# Patient Record
Sex: Female | Born: 1980 | Race: Black or African American | Hispanic: No | Marital: Single | Smoking: Never smoker
Health system: Southern US, Community
[De-identification: ages and names within clinical notes are randomized; demographics above are authoritative.]

## PROBLEM LIST (undated history)

## (undated) DIAGNOSIS — Z803 Family history of malignant neoplasm of breast: Secondary | ICD-10-CM

## (undated) DIAGNOSIS — E669 Obesity, unspecified: Secondary | ICD-10-CM

## (undated) HISTORY — DX: Family history of malignant neoplasm of breast: Z80.3

---
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
## 2001-03-04 ENCOUNTER — Encounter (INDEPENDENT_AMBULATORY_CARE_PROVIDER_SITE_OTHER): Payer: Self-pay

## 2001-03-04 ENCOUNTER — Inpatient Hospital Stay (HOSPITAL_COMMUNITY): Admission: AD | Admit: 2001-03-04 | Discharge: 2001-03-06 | Payer: Self-pay | Admitting: Obstetrics and Gynecology

## 2001-03-11 ENCOUNTER — Encounter: Admission: RE | Admit: 2001-03-11 | Discharge: 2001-04-10 | Payer: Self-pay | Admitting: Obstetrics and Gynecology

## 2002-10-20 ENCOUNTER — Emergency Department (HOSPITAL_COMMUNITY): Admission: EM | Admit: 2002-10-20 | Discharge: 2002-10-20 | Payer: Self-pay | Admitting: Emergency Medicine

## 2004-09-23 ENCOUNTER — Emergency Department (HOSPITAL_COMMUNITY): Admission: EM | Admit: 2004-09-23 | Discharge: 2004-09-24 | Payer: Self-pay | Admitting: Emergency Medicine

## 2006-12-04 ENCOUNTER — Emergency Department (HOSPITAL_COMMUNITY): Admission: EM | Admit: 2006-12-04 | Discharge: 2006-12-04 | Payer: Self-pay | Admitting: Emergency Medicine

## 2007-07-21 ENCOUNTER — Emergency Department (HOSPITAL_COMMUNITY): Admission: EM | Admit: 2007-07-21 | Discharge: 2007-07-21 | Payer: Self-pay | Admitting: Emergency Medicine

## 2007-11-01 ENCOUNTER — Emergency Department (HOSPITAL_COMMUNITY): Admission: EM | Admit: 2007-11-01 | Discharge: 2007-11-01 | Payer: Self-pay | Admitting: Emergency Medicine

## 2009-06-23 ENCOUNTER — Emergency Department (HOSPITAL_COMMUNITY): Admission: EM | Admit: 2009-06-23 | Discharge: 2009-06-23 | Payer: Self-pay | Admitting: Emergency Medicine

## 2009-06-28 ENCOUNTER — Emergency Department (HOSPITAL_COMMUNITY): Admission: EM | Admit: 2009-06-28 | Discharge: 2009-06-28 | Payer: Self-pay | Admitting: Family Medicine

## 2010-09-13 ENCOUNTER — Encounter: Admission: RE | Admit: 2010-09-13 | Discharge: 2010-09-13 | Payer: Self-pay | Admitting: Emergency Medicine

## 2011-02-09 LAB — CBC
HCT: 32.5 % — ABNORMAL LOW (ref 36.0–46.0)
Hemoglobin: 10.7 g/dL — ABNORMAL LOW (ref 12.0–15.0)
MCHC: 32.9 g/dL (ref 30.0–36.0)
MCV: 76.2 fL — ABNORMAL LOW (ref 78.0–100.0)
Platelets: 283 10*3/uL (ref 150–400)
RBC: 4.27 MIL/uL (ref 3.87–5.11)
RDW: 18.2 % — ABNORMAL HIGH (ref 11.5–15.5)
WBC: 6.8 10*3/uL (ref 4.0–10.5)

## 2011-02-09 LAB — BASIC METABOLIC PANEL
BUN: 7 mg/dL (ref 6–23)
CO2: 26 mEq/L (ref 19–32)
Calcium: 8.5 mg/dL (ref 8.4–10.5)
Chloride: 105 mEq/L (ref 96–112)
Creatinine, Ser: 0.74 mg/dL (ref 0.4–1.2)
GFR calc Af Amer: >60 mL/min (ref >60)
GFR calc non Af Amer: >60 mL/min (ref >60)
Glucose, Bld: 90 mg/dL (ref 70–99)
Potassium: 3.9 mEq/L (ref 3.5–5.1)
Sodium: 135 mEq/L (ref 135–145)

## 2011-02-09 LAB — URINALYSIS, ROUTINE W REFLEX MICROSCOPIC
Bilirubin Urine: NEGATIVE
Nitrite: NEGATIVE
Protein, ur: NEGATIVE mg/dL
Urobilinogen, UA: 1 mg/dL (ref 0.0–1.0)

## 2011-02-09 LAB — DIFFERENTIAL
Basophils Absolute: 0.1 10*3/uL (ref 0.0–0.1)
Eosinophils Absolute: 0.1 10*3/uL (ref 0.0–0.7)
Lymphocytes Relative: 29 % (ref 12–46)
Lymphs Abs: 2 10*3/uL (ref 0.7–4.0)
Neutro Abs: 4 10*3/uL (ref 1.7–7.7)

## 2011-02-09 LAB — URINE MICROSCOPIC-ADD ON

## 2011-02-09 LAB — POCT PREGNANCY, URINE: Preg Test, Ur: NEGATIVE

## 2011-02-09 LAB — GLUCOSE, CAPILLARY: Glucose-Capillary: 92 mg/dL (ref 70–99)

## 2011-03-22 NOTE — Op Note (Signed)
[facility]  Patient:    MCKENZEY, PARCELL                       MRN: [id number] Proc. Date: 03/04/01 Adm. Date:  [id number] Attending:  Leonard Schwartz                           Operative Report  PREOPERATIVE DIAGNOSES:       1. Term intrauterine pregnancy.                               2. Second stage bradycardia.                               3. Meconium-stained amniotic fluid.  POSTOPERATIVE DIAGNOSES:      1. Term intrauterine pregnancy.                               2. Second stage bradycardia.                               3. Meconium-stained amniotic fluid.                               4. Nuchal cord.  PROCEDURE:                    Vacuum extraction vaginal delivery with                               repair of two first-degree labial lacerations.  OBSTETRICIAN:                 Janine Limbo, M.D.  ANESTHESIA:                   Epidural.  DISPOSITION:                  Ms. Alessandrini is an 30 year old female, gravida 1, para 0, who presents at 39-1/[redacted] weeks gestation in labor. She slowly progressed her cervix to 10 cm. She pushed for an hour and 45 minutes. She began to develop deep decelerations, some of which had a late component. She was noted to have meconium-stained amniotic fluid previously and an amnioinfusion was performed. The patients temperature increased to 100.7. The decision was made to proceed with delivery. We discussed our options which included continued observation, continued pushing, operative vaginal delivery, and cesarean delivery. The risk and benefits of each of those options were reviewed. The patient, the father of the baby, and her friend all elected to proceed with vacuum extraction vaginal delivery. We discussed the specific risk of vacuum extraction vaginal delivery which included caput formation, hematoma formation, the rare risk of intracranial bleeding, and the possibility that the vacuum extraction  would be unsuccessful and we would still need to proceed with a cesarean delivery.  FINDINGS:                     The weight of the infant is currently not known. A female infant was delivered without difficulty. Her name is  Destiny. The Apgars were 3 at one minute and 7 at five minutes. There was no meconium noted beneath the vocal cords. There was a nuchal cord present. The placenta was normal. There were three vessels to the umbilical cord. There was a first-degree laceration at the right labia and a first-degree laceration on the right labia towards the midline. There were no upper vaginal or cervical lacerations present.  DESCRIPTION OF PROCEDURE:     The patient was placed in a lithotomy position. The perineum was prepped with multiple layers of Betadine and then sterilely draped. The bladder was drained of urine. The fetal head was noted to be at +3 station. The cervix was completely dilated and 100% effaced. The infant was in an occiput anterior presentation. The Kiwi vacuum extractor was applied and the patient was allowed to push. One "pop-off" occurred. The patient was able to deliver the fetal head. The mouth and nose were suctioned using the DeLee trap. A nuchal cord was present and it was reduced. The remainder of the infant was delivered. The cord was clamped and cut. The infant was handed to the awaiting pediatric team. They visualized the vocal cords and there was not meconium noted beneath the vocal cords. The Apgars were 3 at one minute and 7 at five minutes. The infants cords were being visualized at the one minute Apgar. The lacerations were noted. The lacerations were repaired using figure-of-eight sutures of 2-0 Vicryl. The upper vagina and cervix were inspected after the placenta was delivered. No lacerations were noted. The patient was returned to the supine position. The infant was allowed to remain in the room with the mother and the father of the baby for  bonding.  ESTIMATED BLOOD LOSS:         400 cc. DD:  03/04/01 TD:  03/05/01 Job: [id number]

## 2011-03-22 NOTE — H&P (Signed)
[facility]  Patient:    Miranda [name], Miranda [name]                       MRN: [id number] Adm. Date:  [id number] Attending:  Leonard Schwartz Dictator:   Wynelle Bourgeois, CNM                         History and Physical  HISTORY OF PRESENT ILLNESS:   Ms. Krupinski is an 30 year old G1, P0 at 39-3/7 weeks who presented earlier this morning with the complaints of regular uterine contractions every two minutes since 4 a.m.  She denied bleeding or leaking and reported positive fetal movement.  Her pregnancy has been followed by Dr. Erie Noe P. Haygood and remarkable for:  #1 - Adolescence, #2 - first trimester bleeding, #3 - unknown LMP, #4 - anembryonic twin gestation on early ultrasound, #5 - group B strep positive.  PRENATAL LABORATORY DATA:     Hemoglobin 11.5, hematocrit 35.4, platelets 242,000.  Blood type O-positive.  Antibody screen negative.  Sickle cell negative.  Rubella not recorded.  HBsAg negative.  HIV nonreactive.  Group B strep positive.  AFP free beta within normal limits.  Glucose challenge within normal limits.  MEDICAL HISTORY:              Remarkable for history of adoption, therefore, patient has a limited knowledge of her family history and her early history.  GENETIC HISTORY:              Patients cousins had twins and the patient also has cousins who are twins.  SOCIAL HISTORY:               Patient is single but involved with Miranda [name], who is involved and supportive.  She denies any alcohol, tobacco or drug use.  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  She is of the [place] faith.  PHYSICAL EXAMINATION:  VITAL SIGNS:                  Stable.  HEENT:                        Within normal limits.  NECK:                         Thyroid normal, not enlarged.  BREASTS:                      Soft, nontender.  No masses.  CARDIOVASCULAR:               Regular rate and rhythm.  RESPIRATORY:                  Clear to auscultation  bilaterally.  ABDOMEN:                      Gravid, vertex to [employer].  Fetal monitor denotes reactive fetal heart rate tracing, with uterine contractions every two to three minutes.  EXTREMITIES:                  Within normal limits.  PELVIC:                       Cervical exam 5 cm, 75% effaced, -2 station per Dr. Marlowe Sax. Stringers exam.  Her membranes were  ruptured artificially by Dr. Stefano Gaul for return of no fluid.  ASSESSMENT:                   1. Intrauterine pregnancy at 39-3/7 weeks.                               2. Active labor.                               3. Group B streptococcus positive.  PLAN:                         1. Admit to birthing suites.                               2. Routine M.D. orders.                               3. Further plan per Dr. Stefano Gaul. DD:  03/04/01 TD:  03/04/01 Job: [id number]

## 2011-03-22 NOTE — Discharge Summary (Signed)
[facility]  Patient:    Miranda [name], Miranda [name]                         MRN: [id number] Adm. Date:  03/04/01 Disc. Date: 03/06/01 Attending:  Janine Limbo, M.D. Dictator:   Nigel Bridgeman, C.N.M.                           Discharge Summary  ADMITTING DIAGNOSES:          1. Term pregnancy.                               2. Positive group Beta streptococcus.  DISCHARGE DIAGNOSES:          1. Term pregnancy.                               2. Second stage bradycardia.                               3. Meconium fluid.                               4. Nuchal cord.  PROCEDURES:                   1. Vacuum-assisted vaginal birth.                               2. Repair of first-degree labial lacerations.  HOSPITAL COURSE:              Miranda [name] is an 30 year old, gravida 1, para 0 at 39-3/7 weeks who was admitted on Mar 04, 2001 in early labor. Her pregnancy had been remarkable for (1) adolescence, (2) first trimester bleeding, (3) questionable last menstrual period, (4) anembryonic twin gestation on early ultrasound, (5) group B strep positive. On admission, her cervix was 5, 75%, vertex, at a -2 station. Group B strep prophylaxis was undertaken in a usual fashion. The patient did have light meconium stained fluid and an amnioinfusion was begun. Throughout her labor she had sporadic late decelerations, but then they generally did improve. Pitocin was utilized intermittently during her labor. She progressed to fully dilated, and pushed for approximately 1 hour and 45 minutes. She began to develop deep decelerations, some of which had a late component. The option of operative vaginal birth was given to the patient and she did consent to that. Dr. Stefano Gaul then performed a vacuum-assisted vaginal birth for a viable female infant by the name of Miranda [name], weight 7 pounds 10 ounces, Apgars were 3 and 7. There was a nuchal cord noted. There was no meconium below the  vocal cords. The infant tolerated the procedure well and was taken to the full-term nursery. Mother was taken to the recovery phase in good condition. Estimated blood loss was 400 cc.  On postoperative day #1, the patient was doing well. Her hemoglobin was 9.3, down from 11.0. Her physical exam was within normal limits. She remained undecided about birth control. By postpartum day #2, the patient was doing well. She was up ad lib. She was deemed to have received the full  benefit of her hospital stay and was discharged home.  DISCHARGE INSTRUCTIONS:       Instructions are per [facility] handout.  DISCHARGE MEDICATIONS:        1. Motrin 600 mg p.o. q.6h. p.r.n. pain.                               2. Prenatal vitamin one p.o. q.d.  DISCHARGE FOLLOWUP:           Followup will occur in six weeks at [facility]. DD:  03/06/01 TD:  03/06/01 Job: [id number]

## 2013-12-14 ENCOUNTER — Emergency Department (HOSPITAL_COMMUNITY)
Admission: EM | Admit: 2013-12-14 | Discharge: 2013-12-14 | Discharge status: Absent without leave | Payer: BC Managed Care – PPO | Source: Home / Self Care

## 2014-08-08 ENCOUNTER — Other Ambulatory Visit: Payer: Self-pay | Admitting: Obstetrics and Gynecology

## 2014-08-08 DIAGNOSIS — N63 Unspecified lump in unspecified breast: Secondary | ICD-10-CM

## 2014-08-12 ENCOUNTER — Ambulatory Visit
Admission: RE | Admit: 2014-08-12 | Discharge: 2014-08-12 | Disposition: A | Discharge status: Home / Self Care | Payer: BC Managed Care – PPO | Source: Ambulatory Visit | Attending: Obstetrics and Gynecology | Admitting: Obstetrics and Gynecology

## 2014-08-12 DIAGNOSIS — N63 Unspecified lump in unspecified breast: Secondary | ICD-10-CM

## 2015-09-18 ENCOUNTER — Encounter: Payer: Self-pay | Admitting: Internal Medicine

## 2015-09-18 ENCOUNTER — Ambulatory Visit (INDEPENDENT_AMBULATORY_CARE_PROVIDER_SITE_OTHER): Payer: BC Managed Care – PPO | Admitting: Internal Medicine

## 2015-09-18 DIAGNOSIS — IMO0001 Reserved for inherently not codable concepts without codable children: Secondary | ICD-10-CM

## 2015-09-18 DIAGNOSIS — Z803 Family history of malignant neoplasm of breast: Secondary | ICD-10-CM | POA: Insufficient documentation

## 2015-09-18 DIAGNOSIS — Z638 Other specified problems related to primary support group: Secondary | ICD-10-CM | POA: Insufficient documentation

## 2015-09-18 DIAGNOSIS — R030 Elevated blood-pressure reading, without diagnosis of hypertension: Secondary | ICD-10-CM | POA: Insufficient documentation

## 2015-09-18 NOTE — Patient Instructions (Signed)
Bariatric Surgery Information Bariatric surgery, also called weight loss surgery, is a procedure that helps you lose weight. You may consider or your health care provider may suggest bariatric surgery if:  You are severely obese and have been unable to lose weight through diet and exercise.  You have health problems related to obesity, such as:  Type 2 diabetes.  Heart disease.  Lung disease. HOW DOES BARIATRIC SURGERY HELP ME LOSE WEIGHT?  Bariatric surgery helps you lose weight by decreasing how much food your body absorbs. This is done by closing off part of your stomach to make it smaller. This restricts the amount of food your stomach can hold. Bariatric surgery can also change your body's regular digestive process, so that food bypasses the parts of your body that absorb calories and nutrients.  If you decide to have bariatric surgery, it is important to continue to eat a healthy diet and exercise regularly after the surgery.  WHAT ARE THE DIFFERENT KINDS OF BARIATRIC SURGERY?  There are two kinds of bariatric surgeries:  Restrictive surgeries make your stomach smaller. They do not change your digestive process. The smaller the size of your new stomach, the less food you can eat. There are different types of restrictive surgeries.  Malabsorptive surgeries both make your stomach smaller and alter your digestive process so that your body processes less calories and nutrients. These are the most common kind of bariatric surgery. There are different types of malabsorptive surgeries. WHAT ARE THE DIFFERENT TYPES OF RESTRICTIVE SURGERY? Adjustable Gastric Banding In this procedure, an inflatable band is placed around your stomach near the upper end. This makes the passageway for food into the rest of your stomach much smaller. The band can be adjusted, making it tighter or looser, by filling it with salt solution. Your Lippmann can adjust the band based on how are you feeling and how much  weight you are losing. The band can be removed in the future.  Vertical Banded Gastroplasty In this procedure, staples are used to separate your stomach into two parts, a small upper pouch and a bigger lower pouch. This decreases how much food you can eat. Sleeve Gastrectomy In this procedure, your stomach is made smaller. This is done by surgically removing a large part of your stomach. When your stomach is smaller, you feel full more quickly and reduce how much you eat. WHAT ARE THE DIFFERENT TYPES OF MALABSORPTIVE SURGERY? Roux-en-Y Gastric Bypass (RGB) This is the most common weight loss surgery. In this procedure, a small stomach pouch is created in the upper part of your stomach. Next, this small stomach pouch is attached directly to the middle part of your small intestine. The farther down your small intestine the new connection is made, the fewer calories and nutrients you will absorb.  Biliopancreatic Diversion with Duodenal Switch (BPD/DS)  This is a multi-step procedure. In this procedure, a large part of your stomach is removed, making your stomach smaller. Next, this smaller stomach is attached to the lower part of your small intestine. Like the RGB surgery, you absorb fewer calories and nutrients the farther down your small intestine the attachment is made.   WHAT ARE THE RISKS OF BARIATRIC SURGERY? As with any surgical procedure, each type of bariatric surgery has its own risks. These risks also depend on your age, your overall health, and any other medical conditions you may have. When deciding on bariatric surgery, it is very important to:   Talk to your health care provider   and choose the surgery that is best for you.  Ask your health care provider about specific risks for the surgery you choose. FOR MORE INFORMATION  American Society for Metabolic & Bariatric Surgery: www.asmbs.org  Weight-control Information Network (WIN): win.niddk.nih.gov   This information is not  intended to replace advice given to you by your health care provider. Make sure you discuss any questions you have with your health care provider.   Document Released: 10/21/2005 Document Revised: 07/12/2015 Document Reviewed: 04/21/2013 Elsevier Interactive Patient Education 2016 Elsevier Inc.  

## 2015-09-18 NOTE — Progress Notes (Signed)
Pre visit review using our clinic review tool, if applicable. No additional management support is needed unless otherwise documented below in the visit note. 

## 2015-09-18 NOTE — Assessment & Plan Note (Signed)
Discussed diet and exercise Discussed medications like Phentermine She is interested in starting weight watchers and will look more into that

## 2015-09-18 NOTE — Assessment & Plan Note (Signed)
She is not interested in any medication at this time She is interested in trying counseling- referral placed to psychology

## 2015-09-18 NOTE — Assessment & Plan Note (Signed)
Discussed how weight/stress can contribute to her problem Will continue to monitor

## 2015-09-18 NOTE — Assessment & Plan Note (Signed)
She is past due for follow up Will have her call and make an appt

## 2015-09-18 NOTE — Progress Notes (Signed)
HPI  Pt presents to the clinic today to establish care. She has not had a PCP in many years. She does get care from [facility].   Flu: never Tetanus: > 10 years ago Pap Smear: 2015- normal Mammogram: [facility], she goes every 6 months, they are following an abnormal mass, mother died at age 33 of breast cancer Dentist: biannually  Her blood pressure is elevated today at 138/96. She has no history of HTN. She denies chest pain, chest tightness or shortness of breath.  She does reports stress at home dealing with her daughter. She feels like she can never get a break and never has time to herself. Sometimes she gets so stressed out that she starts having chest tightness and shortness of breath. She has no history of anxiety, depression or SI/HI.  She also is not sure what to do about her weight. She feels like she has gained 20 lbs in the last 3 months. She does admit to eating out a lot and not exercising she should. She often skips breakfast or drinks a smoothie. For lunch she will have chicken and rice. For dinner, she will eat meat and veggies. She tries to avoid carbs.   No past medical history on file.  No current outpatient prescriptions on file.   No current facility-administered medications for this visit.    No Known Allergies  Family History  Problem Relation Age of Onset  . Breast cancer Mother 90    Social History   Social History  . Marital Status: Single    Spouse Name: N/A  . Number of Children: N/A  . Years of Education: N/A   Occupational History  . Not on file.   Social History Main Topics  . Smoking status: Never Smoker   . Smokeless tobacco: Never Used  . Alcohol Use: No  . Drug Use: No  . Sexual Activity: Not on file   Other Topics Concern  . Not on file   Social History Narrative  . No narrative on file    ROS:  Constitutional: Pt reports weight gain. Denies fever, malaise, fatigue, headache.  HEENT: Denies eye  pain, eye redness, ear pain, ringing in the ears, wax buildup, runny nose, nasal congestion, bloody nose, or sore throat. Respiratory: Denies difficulty breathing, shortness of breath, cough or sputum production.   Cardiovascular: Pt reports chest tightness. chest pain, palpitations or swelling in the hands or feet.  Gastrointestinal: Denies abdominal pain, bloating, constipation, diarrhea or blood in the stool.  GU: Denies frequency, urgency, pain with urination, blood in urine, odor or discharge. Musculoskeletal: Denies decrease in range of motion, difficulty with gait, muscle pain or joint pain and swelling.  Skin: Denies redness, rashes, lesions or ulcercations.  Neurological: Denies dizziness, difficulty with memory, difficulty with speech or problems with balance and coordination.  Psych: Pt reports stress. Denies anxiety, SI/HI.  No other specific complaints in a complete review of systems (except as listed in HPI above).  PE:  BP 138/96 mmHg  Pulse 96  Temp(Src) 98.2 F (36.8 C) (Oral)  Ht '5\' 1"'  (1.549 m)  Wt 275 lb 8 oz (124.966 kg)  BMI 52.08 kg/m2  SpO2 98%  LMP 09/03/2015 Wt Readings from Last 3 Encounters:  09/18/15 275 lb 8 oz (124.966 kg)    General: Appears her stated age, obese in NAD.  Skin: Warm, dry and intact. Cardiovascular: Normal rate and rhythm. S1,S2 noted.  No murmur, rubs or gallops  noted.  Pulmonary/Chest: Normal effort and positive vesicular breath sounds. No respiratory distress. No wheezes, rales or ronchi noted.  Neurological: Alert and oriented.  Psychiatric: Mood and affect normal. Behavior is normal. Judgment and thought content normal.  :  BMET    Component Value Date/Time   NA 135 06/23/2009 1950   K 3.9 06/23/2009 1950   CL 105 06/23/2009 1950   CO2 26 06/23/2009 1950   GLUCOSE 90 06/23/2009 1950   BUN 7 06/23/2009 1950   CREATININE 0.74 06/23/2009 1950   CALCIUM 8.5 06/23/2009 1950   GFRNONAA >60 06/23/2009 1950   GFRAA   06/23/2009 1950    >60        The eGFR has been calculated using the MDRD equation. This calculation has not been validated in all clinical situations. eGFR's persistently <60 mL/min signify possible Chronic Kidney Disease.    Lipid Panel  No results found for: CHOL, TRIG, HDL, CHOLHDL, VLDL, LDLCALC  CBC    Component Value Date/Time   WBC 6.8 06/23/2009 1950   RBC 4.27 06/23/2009 1950   HGB 10.7* 06/23/2009 1950   HCT 32.5* 06/23/2009 1950   PLT 283 06/23/2009 1950   MCV 76.2* 06/23/2009 1950   MCHC 32.9 06/23/2009 1950   RDW 18.2* 06/23/2009 1950   LYMPHSABS 2.0 06/23/2009 1950   MONOABS 0.6 06/23/2009 1950   EOSABS 0.1 06/23/2009 1950   BASOSABS 0.1 06/23/2009 1950    Hgb A1C No results found for: HGBA1C   Assessment and Plan:

## 2015-10-26 ENCOUNTER — Ambulatory Visit: Payer: BC Managed Care – PPO | Admitting: Psychology

## 2015-11-08 ENCOUNTER — Telehealth: Payer: Self-pay | Admitting: Internal Medicine

## 2015-11-08 ENCOUNTER — Emergency Department (HOSPITAL_COMMUNITY)
Admission: EM | Admit: 2015-11-08 | Discharge: 2015-11-08 | Disposition: A | Discharge status: Home / Self Care | Payer: BC Managed Care – PPO | Attending: Emergency Medicine | Admitting: Emergency Medicine

## 2015-11-08 ENCOUNTER — Emergency Department (HOSPITAL_COMMUNITY): Payer: BC Managed Care – PPO

## 2015-11-08 ENCOUNTER — Encounter (HOSPITAL_COMMUNITY): Payer: Self-pay | Admitting: *Deleted

## 2015-11-08 DIAGNOSIS — R0781 Pleurodynia: Secondary | ICD-10-CM | POA: Diagnosis not present

## 2015-11-08 DIAGNOSIS — M7989 Other specified soft tissue disorders: Secondary | ICD-10-CM | POA: Insufficient documentation

## 2015-11-08 DIAGNOSIS — R079 Chest pain, unspecified: Secondary | ICD-10-CM | POA: Diagnosis present

## 2015-11-08 DIAGNOSIS — R091 Pleurisy: Secondary | ICD-10-CM | POA: Diagnosis not present

## 2015-11-08 DIAGNOSIS — E669 Obesity, unspecified: Secondary | ICD-10-CM | POA: Insufficient documentation

## 2015-11-08 DIAGNOSIS — D649 Anemia, unspecified: Secondary | ICD-10-CM | POA: Insufficient documentation

## 2015-11-08 HISTORY — DX: Obesity, unspecified: E66.9

## 2015-11-08 LAB — BASIC METABOLIC PANEL
Anion gap: 7 (ref 5–15)
BUN: 9 mg/dL (ref 6–20)
CALCIUM: 8.8 mg/dL — AB (ref 8.9–10.3)
CO2: 23 mmol/L (ref 22–32)
CREATININE: 0.53 mg/dL (ref 0.44–1.00)
Chloride: 109 mmol/L (ref 101–111)
GFR calc non Af Amer: >60 mL/min (ref >60)
GLUCOSE: 143 mg/dL — AB (ref 65–99)
Potassium: 3.4 mmol/L — ABNORMAL LOW (ref 3.5–5.1)
Sodium: 139 mmol/L (ref 135–145)

## 2015-11-08 LAB — CBC
HCT: 29.8 % — ABNORMAL LOW (ref 36.0–46.0)
Hemoglobin: 8.9 g/dL — ABNORMAL LOW (ref 12.0–15.0)
MCH: 20.5 pg — AB (ref 26.0–34.0)
MCHC: 29.9 g/dL — AB (ref 30.0–36.0)
MCV: 68.5 fL — ABNORMAL LOW (ref 78.0–100.0)
PLATELETS: 330 10*3/uL (ref 150–400)
RBC: 4.35 MIL/uL (ref 3.87–5.11)
RDW: 19 % — ABNORMAL HIGH (ref 11.5–15.5)
WBC: 6.4 10*3/uL (ref 4.0–10.5)

## 2015-11-08 LAB — I-STAT TROPONIN, ED: TROPONIN I, POC: 0 ng/mL (ref 0.00–0.08)

## 2015-11-08 LAB — D-DIMER, QUANTITATIVE: D-Dimer, Quant: 0.43 ug/mL-FEU (ref 0.00–0.50)

## 2015-11-08 MED ORDER — AZITHROMYCIN 250 MG PO TABS: 250.0000 mg | ORAL_TABLET | Freq: Every day | ORAL | Status: DC

## 2015-11-08 MED ORDER — NAPROXEN 500 MG PO TABS: 500.0000 mg | ORAL_TABLET | Freq: Two times a day (BID) | ORAL | Status: DC

## 2015-11-08 MED ORDER — IBUPROFEN 400 MG PO TABS
600.0000 mg | ORAL_TABLET | Freq: Once | ORAL | Status: AC
  Administered 2015-11-08: 600 mg via ORAL
  Filled 2015-11-08: qty 1

## 2015-11-08 NOTE — Telephone Encounter (Signed)
Per chart review pt is at [facility]. 

## 2015-11-08 NOTE — ED Notes (Signed)
Pt reports chest pains that occur when breathing. Also having back and shoulder pain. ekg done at triage, no acute distress noted.

## 2015-11-08 NOTE — ED Provider Notes (Signed)
CSN: [id number]     Arrival date & time 11/08/15  1437 History   First MD Initiated Contact with Patient 11/08/15 2111     Chief Complaint  Patient presents with  . Chest Pain     (Consider location/radiation/quality/duration/timing/severity/associated sxs/prior Treatment) HPI Miranda [name] is a 35 y.o. female with history of obesity, presents to emergency department complaining of right-sided chest pain. Patient states pain is in the right lower chest radiating into the back. Pain is worsened with deep breathing and coughing. Patient sometimes feels pain with movement. Denies any recent illnesses or flulike symptoms. Denies cough. Denies any recent travel or surgeries. She states she has some ankle swelling in both legs that has been there for some time. She denies any shortness of breath. She denies any history of blood clots. She is not a smoker. She denies any history of similar pain in the past. No nausea or vomiting. No abdominal pain. No fever or chills. No hemoptysis. She has not tried taking any medications for this pain. 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  Past Medical History  Diagnosis Date  . Obesity    History reviewed. No pertinent past surgical history. Family History  Problem Relation Age of Onset  . Adopted: Yes  . Breast cancer Mother 535   Social History  Substance Use Topics  . Smoking status: Never Smoker   . Smokeless tobacco: Never Used  . Alcohol Use: No   OB History    No data available     Review of Systems  Constitutional: Negative for fever and chills.  Respiratory: Positive for chest tightness. Negative for cough, shortness of breath and wheezing.   Cardiovascular: Positive for chest pain and leg swelling. Negative for palpitations.  Gastrointestinal: Negative for nausea, vomiting, abdominal pain and diarrhea.  Musculoskeletal: Negative for myalgias, arthralgias,  neck pain and neck stiffness.  Skin: Negative for rash.  Neurological: Negative for dizziness, weakness and headaches.  All other systems reviewed and are negative.     Allergies  Review of patient's allergies indicates no known allergies.  Home Medications   Prior to Admission medications   Not on File   BP 132/79 mmHg  Pulse 87  Temp(Src) 99.5 F (37.5 C) (Oral)  Resp 18  Ht 5' (1.524 m)  Wt 122.471 kg  BMI 52.73 kg/m2  SpO2 99%  LMP 10/05/2015 Physical Exam  Constitutional: She appears well-developed and well-nourished. No distress.  HENT:  Head: Normocephalic.  Eyes: Conjunctivae are normal.  Neck: Neck supple.  Cardiovascular: Normal rate, regular rhythm and normal heart sounds.   Pulmonary/Chest: Effort normal and breath sounds normal. No respiratory distress. She has no wheezes. She has no rales. She exhibits no tenderness.  Abdominal: Soft. There is no tenderness. There is no rebound.  Musculoskeletal: She exhibits no edema.  Neurological: She is alert.  Skin: Skin is warm and dry.  Psychiatric: She has a normal mood and affect. Her behavior is normal.  Nursing note and vitals reviewed.   ED Course  Procedures (including critical care time) Labs Review Labs Reviewed  BASIC METABOLIC PANEL - Abnormal; Notable for the following:    Potassium 3.4 (*)    Glucose, Bld 143 (*)    Calcium 8.8 (*)    All other components within normal limits  CBC - Abnormal; Notable for the following:    Hemoglobin 8.9 (*)  HCT 29.8 (*)    MCV 68.5 (*)    MCH 20.5 (*)    MCHC 29.9 (*)    RDW 19.0 (*)    All other components within normal limits  D-DIMER, QUANTITATIVE (NOT AT [facility])  Rosezena Sensor, ED    Imaging Review Dg Chest 2 View  11/08/2015  CLINICAL DATA:  Chest pain, right shoulder pain, and back pain for 1 week. EXAM: CHEST  2 VIEW COMPARISON:  07/21/2007 FINDINGS: The cardiac silhouette is mildly enlarged, stable to minimally more prominent than on the  prior study. The patient has taken a slightly shallower inspiration than on the prior study with mild bronchovascular crowding. Slightly increased density is present in the right infrahilar region, without corresponding abnormality identified on the lateral radiograph. No edema, pleural effusion, or pneumothorax is identified. No acute osseous abnormality is identified. IMPRESSION: Mild cardiomegaly. Shallower inspiration with minimal right basilar opacity favored to reflect atelectasis although developing infiltrate is not completely excluded. Electronically Signed   By: Sebastian Ache M.D.   On: 11/08/2015 15:30   I have personally reviewed and evaluated these images and lab results as part of my medical decision-making.   EKG Interpretation   Date/Time:  Wednesday November 08 2015 14:41:59 EST Ventricular Rate:  86 PR Interval:  176 QRS Duration: 98 QT Interval:  388 QTC Calculation: 464 R Axis:   113 Text Interpretation:  Normal sinus rhythm Incomplete right bundle branch  block Possible Right ventricular hypertrophy Abnormal ECG No significant  change since last tracing Confirmed by Bebe Shaggy  MD, Dorinda Hill ([id number]) on  11/08/2015 9:16:07 PM      MDM   Final diagnoses:  Right-sided chest pain  Pleurisy  Anemia, unspecified anemia type    patient in emergency department with complaint of right-sided pleuritic chest pain. She denies any associated symptoms. Specifically no risk factors for PE, no cough, no fever, no flulike symptoms. Her pain is worse with deep breathing and movement. Patient's vital signs are normal. She is not hypoxic or tachycardic. She is low risk for PE,  D-dimer was obtained and negative.  Her lab work was obtained at triage and showed low hemoglobin , with microcytic anemia. Most likely from her heavy vaginal bleeding. She states she  Was told similar before approximately  10 years ago and had to be on iron. Instructed her to start taking her supplements and follow-up  with her doctor. Patient's glucose also elevated 143, advised to watch her diet and follow-up with her doctor for prediabetes testing. Her chest x-ray shows  Atelectasis versus developing infiltrate in the right lung, which corresponds to where patient's pain is.   Will start on Z-Pak to cover for possible early pneumonia, however most likely pleurisy versus atelectasis. Will start on NSAIDs. Instructed her to follow with her doctor in 3-5 days if pain is not improving for recheck. Pt voiced understanding and had no further questions.   Filed Vitals:   11/08/15 1448  BP: 132/79  Pulse: 87  Temp: 99.5 F (37.5 C)  TempSrc: Oral  Resp: 18  Height: 5' (1.524 m)  Weight: 122.471 kg  SpO2: 99%      Jaynie Crumble, PA-C 11/08/15 2234  Zadie Rhine, MD 11/09/15 [phone]

## 2015-11-08 NOTE — Discharge Instructions (Signed)
Naproxen for pain and inflammation as prescribed. Take Z-Pak for possible early pneumonia that was seen on x-ray. Your lab work shows anemia, please start taking iron supplements and follow-up with your doctor for recheck. Yuor lab work also shows slightly elevated blood sugar, watch your diet closely, exercise, follow with you doctor for recheck. If not improving return or be rechecked in 3-5 days.   Pleurisy Pleurisy is an inflammation and swelling of the lining of the lungs (pleura). Because of this inflammation, it hurts to breathe. It can be aggravated by coughing, laughing, or deep breathing. Pleurisy is often caused by an underlying infection or disease.  HOME CARE INSTRUCTIONS  Monitor your pleurisy for any changes. The following actions may help to alleviate any discomfort you are experiencing:  Medicine may help with pain. Only take over-the-counter or prescription medicines for pain, discomfort, or fever as directed by your health care provider.  Only take antibiotic medicine as directed. Make sure to finish it even if you start to feel better. SEEK MEDICAL CARE IF:   Your pain is not controlled with medicine or is increasing.  You have an increase in pus-like (purulent) secretions brought up with coughing. SEEK IMMEDIATE MEDICAL CARE IF:   You have blue or dark lips, fingernails, or toenails.  You are coughing up blood.  You have increased difficulty breathing.  You have continuing pain unrelieved by medicine or pain lasting more than 1 week.  You have pain that radiates into your neck, arms, or jaw.  You develop increased shortness of breath or wheezing.  You develop a fever, rash, vomiting, fainting, or other serious symptoms. MAKE SURE YOU:  Understand these instructions.   Will watch your condition.   Will get help right away if you are not doing well or get worse.    This information is not intended to replace advice given to you by your health care  provider. Make sure you discuss any questions you have with your health care provider.   Document Released: 10/21/2005 Document Revised: 06/23/2013 Document Reviewed: 04/04/2013 Elsevier Interactive Patient Education [employer]2016 [employer] Inc.

## 2015-11-08 NOTE — Telephone Encounter (Signed)
[facility] [place] [facility] - Client TELEPHONE ADVICE RECORD [facility] Patient Name: Miranda [name] DOB: 03/08/1981 Initial Comment Caller states she has chest pains. Nurse Assessment Nurse: Roderic OvensNorth, RN, Amy Date/Time Lamount Cohen(Eastern Time): 11/08/2015 1:29:52 PM Confirm and document reason for call. If symptomatic, describe symptoms. ---CALLER STATES THAT WHEN SHE IS TAKING A DEEP BREATH SHE IS HAVING CHEST PAIN. SHE STATES THIS HAS BEEN GOING ON FOR A BOUT A WEEK. SHE STATES IT HAS WORSENING SYMPTOMS. SHE THOUGHT IS WAS A WEEK NOW. WHEN SHE PRESSES ON THE CHEST IT DOES NOT HURT. WHEN SHE INHALES IS WHEN THE PAIN IS THERE. PRESSURE WHEN SHE INHALES AND A SHARP PAIN WHEN SHE BREATHES OUT. SHE STATES IT HAS DONE THIS BEFORE AND SHE HAS GONE INTO URGENT CARE FOR IT. Has the patient traveled out of the country within the last 30 days? ---Not Applicable Does the patient have any new or worsening symptoms? ---Yes Will a triage be completed? ---Yes Related visit to physician within the last 2 weeks? ---No Does the PT have any chronic conditions? (i.e. diabetes, asthma, etc.) ---No Did the patient indicate they were pregnant? ---No Is this a behavioral health or substance abuse call? ---No Guidelines Guideline Title Affirmed Question Affirmed Notes Chest Pain Pain also present in shoulder(s) or arm(s) or jaw (Exception: pain is clearly made worse by movement) Final Disposition User Go to ED Now Roderic OvensNorth, RN, Amy [facility] Disagree/Comply: Comply

## 2015-12-25 ENCOUNTER — Ambulatory Visit (INDEPENDENT_AMBULATORY_CARE_PROVIDER_SITE_OTHER)
Admission: RE | Admit: 2015-12-25 | Discharge: 2015-12-25 | Disposition: A | Discharge status: Home / Self Care | Payer: BC Managed Care – PPO | Source: Ambulatory Visit | Attending: Internal Medicine | Admitting: Internal Medicine

## 2015-12-25 ENCOUNTER — Encounter: Payer: Self-pay | Admitting: Internal Medicine

## 2015-12-25 ENCOUNTER — Ambulatory Visit (INDEPENDENT_AMBULATORY_CARE_PROVIDER_SITE_OTHER): Payer: BC Managed Care – PPO | Admitting: Internal Medicine

## 2015-12-25 VITALS — BP 138/94 | HR 79 | Temp 98.2°F | Wt 275.0 lb

## 2015-12-25 DIAGNOSIS — R2232 Localized swelling, mass and lump, left upper limb: Secondary | ICD-10-CM | POA: Diagnosis not present

## 2015-12-25 DIAGNOSIS — J189 Pneumonia, unspecified organism: Secondary | ICD-10-CM

## 2015-12-25 DIAGNOSIS — J181 Lobar pneumonia, unspecified organism: Principal | ICD-10-CM

## 2015-12-25 NOTE — Progress Notes (Signed)
Subjective:    Patient ID: Miranda [name], female    DOB: October 14, 1981, 35 y.o.   MRN: [id number]  HPI  Pt presents to the clinic today for ER follow up from 11/08/15. She went to the ER c/o right sided chest wall pain. ECG showed incomplete right bundle branch block. She did have a slight anemia on her labs, 8.9/29.8. Her D dimer was normal. Her chest xray showed possible RLL infiltrate. She was treated for a possible pneumonia with Azithromax. She reports she is feeling much better. She occasionally has left upper chest pain. She usually notices this first thing in the morning after waking up. She describes the pain as tightness. She denies shortness of breath. She feels like it is because she is sleeping on her left side. She denies reflux or heartburn. She has not taken anything OTC for her left side chest pain.  She also has a lump on her left shoulder she would like to get checked out. She noticed this a few months ago. The lump is not tender. She has noticed that it has gotten bigger is size. It has not drained. She did try to pop it with a pin, but nothing drained from it. She has never had a lump like this before.  Review of Systems  Past Medical History  Diagnosis Date  . Obesity     No current outpatient prescriptions on file.   No current facility-administered medications for this visit.    No Known Allergies  Family History  Problem Relation Age of Onset  . Adopted: Yes  . Breast cancer Mother 20    Social History   Social History  . Marital Status: Single    Spouse Name: N/A  . Number of Children: N/A  . Years of Education: N/A   Occupational History  . Not on file.   Social History Main Topics  . Smoking status: Never Smoker   . Smokeless tobacco: Never Used  . Alcohol Use: No  . Drug Use: No  . Sexual Activity: No   Other Topics Concern  . Not on file   Social History Narrative     Constitutional: Denies fever, malaise, fatigue, headache or abrupt  weight changes.  HEENT: Denies eye pain, eye redness, ear pain, ringing in the ears, wax buildup, runny nose, nasal congestion, bloody nose, or sore throat. Respiratory: Denies difficulty breathing, shortness of breath, cough or sputum production.   Cardiovascular: Pt reports left chest tightness. Denies chest pain, chest tightness, palpitations or swelling in the hands or feet.   Skin: Pt reports lump on her left shoulder. Denies redness, rashes, or ulcercations.  Neurological: Denies dizziness, difficulty with memory, difficulty with speech or problems with balance and coordination.  No other specific complaints in a complete review of systems (except as listed in HPI above).     Objective:   Physical Exam  BP 138/94 mmHg  Pulse 79  Temp(Src) 98.2 F (36.8 C) (Oral)  Wt 275 lb (124.739 kg)  SpO2 98%  LMP 12/01/2015 Wt Readings from Last 3 Encounters:  12/25/15 275 lb (124.739 kg)  11/08/15 270 lb (122.471 kg)  09/18/15 275 lb 8 oz (124.966 kg)    General: Appears her  stated age, obese in NAD. Skin: Warm, dry and intact. Pea sized hyperpigmented nodule noted on left shoulder. Cardiovascular: Normal rate and rhythm. S1,S2 noted.  No murmur, rubs or gallops noted. Pulmonary/Chest: Normal effort and positive vesicular breath sounds. No respiratory distress. No wheezes, rales  or ronchi noted.  Neurological: Alert and oriented.Marland Kitchen  Psychiatric: Mood and affect normal. Behavior is normal. Judgment and thought content normal.     BMET    Component Value Date/Time   NA 139 11/08/2015 1502   K 3.4* 11/08/2015 1502   CL 109 11/08/2015 1502   CO2 23 11/08/2015 1502   GLUCOSE 143* 11/08/2015 1502   BUN 9 11/08/2015 1502   CREATININE 0.53 11/08/2015 1502   CALCIUM 8.8* 11/08/2015 1502   GFRNONAA >60 11/08/2015 1502   GFRAA >60 11/08/2015 1502    Lipid Panel  No results found for: CHOL, TRIG, HDL, CHOLHDL, VLDL, LDLCALC  CBC    Component Value Date/Time   WBC 6.4 11/08/2015  1502   RBC 4.35 11/08/2015 1502   HGB 8.9* 11/08/2015 1502   HCT 29.8* 11/08/2015 1502   PLT 330 11/08/2015 1502   MCV 68.5* 11/08/2015 1502   MCH 20.5* 11/08/2015 1502   MCHC 29.9* 11/08/2015 1502   RDW 19.0* 11/08/2015 1502   LYMPHSABS 2.0 06/23/2009 1950   MONOABS 0.6 06/23/2009 1950   EOSABS 0.1 06/23/2009 1950   BASOSABS 0.1 06/23/2009 1950    Hgb A1C No results found for: HGBA1C       Assessment & Plan:   Hospital follow up for RLL pneumonia:  Hospital notes, labs and imaging reviewed Follow up chest xray today to check for resolution of PNA  Lump, 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

## 2015-12-25 NOTE — Progress Notes (Signed)
Pre visit review using our clinic review tool, if applicable. No additional management support is needed unless otherwise documented below in the visit note. 

## 2015-12-25 NOTE — Patient Instructions (Signed)

## 2015-12-26 ENCOUNTER — Ambulatory Visit: Payer: BC Managed Care – PPO | Admitting: Internal Medicine

## 2016-01-11 ENCOUNTER — Other Ambulatory Visit: Payer: Self-pay | Admitting: Internal Medicine

## 2016-01-11 DIAGNOSIS — Z0000 Encounter for general adult medical examination without abnormal findings: Secondary | ICD-10-CM

## 2016-01-16 ENCOUNTER — Other Ambulatory Visit: Payer: BC Managed Care – PPO

## 2016-01-23 ENCOUNTER — Ambulatory Visit (INDEPENDENT_AMBULATORY_CARE_PROVIDER_SITE_OTHER): Payer: BC Managed Care – PPO | Admitting: Internal Medicine

## 2016-01-23 ENCOUNTER — Encounter: Payer: Self-pay | Admitting: Internal Medicine

## 2016-01-23 VITALS — BP 138/90 | HR 92 | Temp 98.3°F | Ht 60.0 in | Wt 279.0 lb

## 2016-01-23 DIAGNOSIS — Z0001 Encounter for general adult medical examination with abnormal findings: Secondary | ICD-10-CM | POA: Diagnosis not present

## 2016-01-23 DIAGNOSIS — L989 Disorder of the skin and subcutaneous tissue, unspecified: Secondary | ICD-10-CM

## 2016-01-23 DIAGNOSIS — Z0000 Encounter for general adult medical examination without abnormal findings: Secondary | ICD-10-CM

## 2016-01-23 LAB — HEMOGLOBIN A1C: Hgb A1c MFr Bld: 6.6 % — ABNORMAL HIGH (ref 4.6–6.5)

## 2016-01-23 LAB — LIPID PANEL
CHOLESTEROL: 158 mg/dL (ref 0–200)
HDL: 54.4 mg/dL (ref >39.00)
LDL CALC: 94 mg/dL (ref 0–99)
NonHDL: 103.71
TRIGLYCERIDES: 50 mg/dL (ref 0.0–149.0)
Total CHOL/HDL Ratio: 3
VLDL: 10 mg/dL (ref 0.0–40.0)

## 2016-01-23 LAB — COMPREHENSIVE METABOLIC PANEL
ALT: 16 U/L (ref 0–35)
AST: 19 U/L (ref 0–37)
Albumin: 4 g/dL (ref 3.5–5.2)
Alkaline Phosphatase: 77 U/L (ref 39–117)
BILIRUBIN TOTAL: 0.4 mg/dL (ref 0.2–1.2)
BUN: 9 mg/dL (ref 6–23)
CALCIUM: 9 mg/dL (ref 8.4–10.5)
CHLORIDE: 106 meq/L (ref 96–112)
CO2: 26 meq/L (ref 19–32)
Creatinine, Ser: 0.65 mg/dL (ref 0.40–1.20)
GFR: 134.35 mL/min (ref >60.00)
Glucose, Bld: 96 mg/dL (ref 70–99)
Potassium: 3.7 mEq/L (ref 3.5–5.1)
Sodium: 138 mEq/L (ref 135–145)
Total Protein: 7.2 g/dL (ref 6.0–8.3)

## 2016-01-23 LAB — CBC
HEMATOCRIT: 29.4 % — AB (ref 36.0–46.0)
HEMOGLOBIN: 9.1 g/dL — AB (ref 12.0–15.0)
MCHC: 30.8 g/dL (ref 30.0–36.0)
PLATELETS: 446 10*3/uL — AB (ref 150.0–400.0)
RBC: 4.38 Mil/uL (ref 3.87–5.11)
RDW: 20.8 % — ABNORMAL HIGH (ref 11.5–15.5)
WBC: 7.3 10*3/uL (ref 4.0–10.5)

## 2016-01-23 LAB — TSH: TSH: 2.43 u[IU]/mL (ref 0.35–4.50)

## 2016-01-23 NOTE — Patient Instructions (Signed)
Health Maintenance, Female Adopting a healthy lifestyle and getting preventive care can go a long way to promote health and wellness. Talk with your health care provider about what schedule of regular examinations is right for you. This is a good chance for you to check in with your provider about disease prevention and staying healthy. In between checkups, there are plenty of things you can do on your own. Experts have done a lot of research about which lifestyle changes and preventive measures are most likely to keep you healthy. Ask your health care provider for more information. WEIGHT AND DIET  Eat a healthy diet  Be sure to include plenty of vegetables, fruits, low-fat dairy products, and lean protein.  Do not eat a lot of foods high in solid fats, added sugars, or salt.  Get regular exercise. This is one of the most important things you can do for your health.  Most adults should exercise for at least 150 minutes each week. The exercise should increase your heart rate and make you sweat (moderate-intensity exercise).  Most adults should also do strengthening exercises at least twice a week. This is in addition to the moderate-intensity exercise.  Maintain a healthy weight  Body mass index (BMI) is a measurement that can be used to identify possible weight problems. It estimates body fat based on height and weight. Your health care provider can help determine your BMI and help you achieve or maintain a healthy weight.  For females 31 years of age and older:   A BMI below 18.5 is considered underweight.  A BMI of 18.5 to 24.9 is normal.  A BMI of 25 to 29.9 is considered overweight.  A BMI of 30 and above is considered obese.  Watch levels of cholesterol and blood lipids  You should start having your blood tested for lipids and cholesterol at 34 years of age, then have this test every 5 years.  You may need to have your cholesterol levels checked more often if:  Your lipid  or cholesterol levels are high.  You are older than 34 years of age.  You are at high risk for heart disease.  CANCER SCREENING   Lung Cancer  Lung cancer screening is recommended for adults 44-39 years old who are at high risk for lung cancer because of a history of smoking.  A yearly low-dose CT scan of the lungs is recommended for people who:  Currently smoke.  Have quit within the past 15 years.  Have at least a 30-pack-year history of smoking. A pack year is smoking an average of one pack of cigarettes a day for 1 year.  Yearly screening should continue until it has been 15 years since you quit.  Yearly screening should stop if you develop a health problem that would prevent you from having lung cancer treatment.  Breast Cancer  Practice breast self-awareness. This means understanding how your breasts normally appear and feel.  It also means doing regular breast self-exams. Let your health care provider know about any changes, no matter how small.  If you are in your 20s or 30s, you should have a clinical breast exam (CBE) by a health care provider every 1-3 years as part of a regular health exam.  If you are 75 or older, have a CBE every year. Also consider having a breast X-ray (mammogram) every year.  If you have a family history of breast cancer, talk to your health care provider about genetic screening.  If you  are at high risk for breast cancer, talk to your health care provider about having an MRI and a mammogram every year.  Breast cancer gene (BRCA) assessment is recommended for women who have family members with BRCA-related cancers. BRCA-related cancers include:  Breast.  Ovarian.  Tubal.  Peritoneal cancers.  Results of the assessment will determine the need for genetic counseling and BRCA1 and BRCA2 testing. Cervical Cancer Your health care provider may recommend that you be screened regularly for cancer of the pelvic organs (ovaries, uterus, and  vagina). This screening involves a pelvic examination, including checking for microscopic changes to the surface of your cervix (Pap test). You may be encouraged to have this screening done every 3 years, beginning at age 25.  For women ages 74-65, health care providers may recommend pelvic exams and Pap testing every 3 years, or they may recommend the Pap and pelvic exam, combined with testing for human papilloma virus (HPV), every 5 years. Some types of HPV increase your risk of cervical cancer. Testing for HPV may also be done on women of any age with unclear Pap test results.  Other health care providers may not recommend any screening for nonpregnant women who are considered low risk for pelvic cancer and who do not have symptoms. Ask your health care provider if a screening pelvic exam is right for you.  If you have had past treatment for cervical cancer or a condition that could lead to cancer, you need Pap tests and screening for cancer for at least 20 years after your treatment. If Pap tests have been discontinued, your risk factors (such as having a new sexual partner) need to be reassessed to determine if screening should resume. Some women have medical problems that increase the chance of getting cervical cancer. In these cases, your health care provider may recommend more frequent screening and Pap tests. Colorectal Cancer  This type of cancer can be detected and often prevented.  Routine colorectal cancer screening usually begins at 33 years of age and continues through 35 years of age.  Your health care provider may recommend screening at an earlier age if you have risk factors for colon cancer.  Your health care provider may also recommend using home test kits to check for hidden blood in the stool.  A small camera at the end of a tube can be used to examine your colon directly (sigmoidoscopy or colonoscopy). This is done to check for the earliest forms of colorectal  cancer.  Routine screening usually begins at age 29.  Direct examination of the colon should be repeated every 5-10 years through 34 years of age. However, you may need to be screened more often if early forms of precancerous polyps or small growths are found. Skin Cancer  Check your skin from head to toe regularly.  Tell your health care provider about any new moles or changes in moles, especially if there is a change in a mole's shape or color.  Also tell your health care provider if you have a mole that is larger than the size of a pencil eraser.  Always use sunscreen. Apply sunscreen liberally and repeatedly throughout the day.  Protect yourself by wearing long sleeves, pants, a wide-brimmed hat, and sunglasses whenever you are outside. HEART DISEASE, DIABETES, AND HIGH BLOOD PRESSURE   High blood pressure causes heart disease and increases the risk of stroke. High blood pressure is more likely to develop in:  People who have blood pressure in the high end  of the normal range (130-139/85-89 mm Hg).  People who are overweight or obese.  People who are African American.  If you are 58-65 years of age, have your blood pressure checked every 3-5 years. If you are 21 years of age or older, have your blood pressure checked every year. You should have your blood pressure measured twice--once when you are at a hospital or clinic, and once when you are not at a hospital or clinic. Record the average of the two measurements. To check your blood pressure when you are not at a hospital or clinic, you can use:  An automated blood pressure machine at a pharmacy.  A home blood pressure monitor.  If you are between 28 years and 46 years old, ask your health care provider if you should take aspirin to prevent strokes.  Have regular diabetes screenings. This involves taking a blood sample to check your fasting blood sugar level.  If you are at a normal weight and have a low risk for diabetes,  have this test once every three years after 34 years of age.  If you are overweight and have a high risk for diabetes, consider being tested at a younger age or more often. PREVENTING INFECTION  Hepatitis B  If you have a higher risk for hepatitis B, you should be screened for this virus. You are considered at high risk for hepatitis B if:  You were born in a country where hepatitis B is common. Ask your health care provider which countries are considered high risk.  Your parents were born in a high-risk country, and you have not been immunized against hepatitis B (hepatitis B vaccine).  You have HIV or AIDS.  You use needles to inject street drugs.  You live with someone who has hepatitis B.  You have had sex with someone who has hepatitis B.  You get hemodialysis treatment.  You take certain medicines for conditions, including cancer, organ transplantation, and autoimmune conditions. Hepatitis C  Blood testing is recommended for:  Everyone born from [birth year] through 1964.  Anyone with known risk factors for hepatitis C. Sexually transmitted infections (STIs)  You should be screened for sexually transmitted infections (STIs) including gonorrhea and chlamydia if:  You are sexually active and are younger than 34 years of age.  You are older than 34 years of age and your health care provider tells you that you are at risk for this type of infection.  Your sexual activity has changed since you were last screened and you are at an increased risk for chlamydia or gonorrhea. Ask your health care provider if you are at risk.  If you do not have HIV, but are at risk, it may be recommended that you take a prescription medicine daily to prevent HIV infection. This is called pre-exposure prophylaxis (PrEP). You are considered at risk if:  You are sexually active and do not regularly use condoms or know the HIV status of your partner(s).  You take drugs by injection.  You are sexually  active with a partner who has HIV. Talk with your health care provider about whether you are at high risk of being infected with HIV. If you choose to begin PrEP, you should first be tested for HIV. You should then be tested every 3 months for as long as you are taking PrEP.  PREGNANCY   If you are premenopausal and you may become pregnant, ask your health care provider about preconception counseling.  If you may  become pregnant, take 400 to 800 micrograms (mcg) of folic acid every day.  If you want to prevent pregnancy, talk to your health care provider about birth control (contraception). OSTEOPOROSIS AND MENOPAUSE   Osteoporosis is a disease in which the bones lose minerals and strength with aging. This can result in serious bone fractures. Your risk for osteoporosis can be identified using a bone density scan.  If you are 61 years of age or older, or if you are at risk for osteoporosis and fractures, ask your health care provider if you should be screened.  Ask your health care provider whether you should take a calcium or vitamin D supplement to lower your risk for osteoporosis.  Menopause may have certain physical symptoms and risks.  Hormone replacement therapy may reduce some of these symptoms and risks. Talk to your health care provider about whether hormone replacement therapy is right for you.  HOME CARE INSTRUCTIONS   Schedule regular health, dental, and eye exams.  Stay current with your immunizations.   Do not use any tobacco products including cigarettes, chewing tobacco, or electronic cigarettes.  If you are pregnant, do not drink alcohol.  If you are breastfeeding, limit how much and how often you drink alcohol.  Limit alcohol intake to no more than 1 drink per day for nonpregnant women. One drink equals 12 ounces of beer, 5 ounces of wine, or 1 ounces of hard liquor.  Do not use street drugs.  Do not share needles.  Ask your health care provider for help if  you need support or information about quitting drugs.  Tell your health care provider if you often feel depressed.  Tell your health care provider if you have ever been abused or do not feel safe at home.   This information is not intended to replace advice given to you by your health care provider. Make sure you discuss any questions you have with your health care provider.   Document Released: 05/06/2011 Document Revised: 11/11/2014 Document Reviewed: 09/22/2013 Elsevier Interactive Patient Education [employer].

## 2016-01-23 NOTE — Progress Notes (Addendum)
Subjective:    Patient ID: Miranda [name], female    DOB: 02/08/1981, 35 y.o.   MRN: [id number]  HPI  Pt presents to the clinic today for her annual exam.  Flu: never Tetanus: > 10 years ago Pap Smear: 08/2014 [facility] Dentist: as needed  Diet: She does eat meat. She consumes fruit and veggies. She tries to avoid fried foods. She drinks mostly water. Exercise: None. She does get 6000-10000 steps in a day.  She also wants to have a lesion biopsied on her left shoulder.  Review of Systems      Past Medical History  Diagnosis Date  . Obesity     No current outpatient prescriptions on file.   No current facility-administered medications for this visit.    No Known Allergies  Family History  Problem Relation Age of Onset  . Adopted: Yes  . Breast cancer Mother [id number]    Social History   Social History  . Marital Status: Single    Spouse Name: N/A  . Number of Children: N/A  . Years of Education: N/A   Occupational History  . Not on file.   Social History Main Topics  . Smoking status: Never Smoker   . Smokeless tobacco: Never Used  . Alcohol Use: No  . Drug Use: No  . Sexual Activity: No   Other Topics Concern  . Not on file   Social History Narrative     Constitutional: Denies fever, malaise, fatigue, headache or abrupt weight changes.  HEENT: Denies eye pain, eye redness, ear pain, ringing in the ears, wax buildup, runny nose, nasal congestion, bloody nose, or sore throat. Respiratory: Denies difficulty breathing, shortness of breath, cough or sputum production.   Cardiovascular: Denies chest pain, chest tightness, palpitations or swelling in the hands or feet.  Gastrointestinal: Denies abdominal pain, bloating, constipation, diarrhea or blood in the stool.  GU: Denies urgency, frequency, pain with urination, burning sensation, blood in urine, odor or discharge. Musculoskeletal: Denies decrease in range of motion,  difficulty with gait, muscle pain or joint pain and swelling.  Skin: Pt reports skin lesion on left shoulder .Denies redness, rashes, or ulcercations.  Neurological: Denies dizziness, difficulty with memory, difficulty with speech or problems with balance and coordination.  Psych: Denies anxiety, depression, SI/HI.  No other specific complaints in a complete review of systems (except as listed in HPI above).  Objective:   Physical Exam   BP 138/90 mmHg  Pulse 92  Temp(Src) 98.3 F (36.8 C) (Oral)  Ht 5' (1.524 m)  Wt 279 lb (126.554 kg)  BMI 54.49 kg/m2  SpO2 98%  LMP 01/17/2016 Wt Readings from Last 3 Encounters:  01/23/16 279 lb (126.554 kg)  12/25/15 275 lb (124.739 kg)  11/08/15 270 lb (122.471 kg)    General: Appears her stated age, obese in NAD. Skin: Warm, dry and intact. Pea sized (< 0.5 cm) nodule noted to left shoulder. HEENT: Head: normal shape and size; Eyes: sclera white, no icterus, conjunctiva pink, PERRLA and EOMs intact; Ears: Tm's gray and intact, normal light reflex; Throat/Mouth: Teeth present, mucosa pink and moist, no exudate, lesions or ulcerations noted.  Neck:  Neck supple, trachea midline. No masses, lumps or thyromegaly present.  Cardiovascular: Normal rate and rhythm. S1,S2 noted.  No murmur, rubs or gallops noted. Trace BLE edema.  Pulmonary/Chest: Normal effort and positive vesicular breath sounds. No respiratory distress. No wheezes, rales or ronchi noted.  Abdomen: Soft and  nontender. Normal bowel sounds. No distention or masses noted. Liver, spleen and kidneys non palpable. Musculoskeletal: Strength 5/5 BUE/BLE. No signs of joint swelling. No difficulty with gait.  Neurological: Alert and oriented. Cranial nerves II-XII grossly intact. Coordination normal.  Psychiatric: Mood and affect normal. Behavior is normal. Judgment and thought content normal.    BMET    Component Value Date/Time   NA 139 11/08/2015 1502   K 3.4* 11/08/2015 1502   CL  109 11/08/2015 1502   CO2 23 11/08/2015 1502   GLUCOSE 143* 11/08/2015 1502   BUN 9 11/08/2015 1502   CREATININE 0.53 11/08/2015 1502   CALCIUM 8.8* 11/08/2015 1502   GFRNONAA >60 11/08/2015 1502   GFRAA >60 11/08/2015 1502    Lipid Panel  No results found for: CHOL, TRIG, HDL, CHOLHDL, VLDL, LDLCALC  CBC    Component Value Date/Time   WBC 6.4 11/08/2015 1502   RBC 4.35 11/08/2015 1502   HGB 8.9* 11/08/2015 1502   HCT 29.8* 11/08/2015 1502   PLT 330 11/08/2015 1502   MCV 68.5* 11/08/2015 1502   MCH 20.5* 11/08/2015 1502   MCHC 29.9* 11/08/2015 1502   RDW 19.0* 11/08/2015 1502   LYMPHSABS 2.0 06/23/2009 1950   MONOABS 0.6 06/23/2009 1950   EOSABS 0.1 06/23/2009 1950   BASOSABS 0.1 06/23/2009 1950    Hgb A1C No results found for: HGBA1C      Assessment & Plan:   Preventative Health Maintenance:  She declines flu shot today Tetanus today Mammogram ordered, [facility] to [facility] Pap due 2018 Encouraged her to see a dentist at least yearly Encouraged her to consume a balanced diet and start an exercise regimen Will check CBC, CMET, Lipid, A1C and HIV today  Procedure note:  Informed consent obtained after discussing risk and benefits of procedure- copy scanned in chart Area cleansed with Betadine x 3 Area numbed with Lidocaine with Epi- 1 ml Area excised using shave biopsy- sent for pathology (will call you with the results) Area cauterized Placed bandage with TAB- change daily, after care instructions given  RTC in 1 year, sooner if needed

## 2016-01-23 NOTE — Addendum Note (Signed)
Addended by: Roena MaladyEVONTENNO, Wash Nienhaus Y on: 01/23/2016 05:07 PM   Modules accepted: Orders

## 2016-01-23 NOTE — Addendum Note (Signed)
Addended by: Alvina ChouWALSH, TERRI J on: 01/23/2016 02:29 PM   Modules accepted: Orders

## 2016-01-23 NOTE — Progress Notes (Signed)
Pre visit review using our clinic review tool, if applicable. No additional management support is needed unless otherwise documented below in the visit note. 

## 2016-01-24 LAB — HIV ANTIBODY (ROUTINE TESTING W REFLEX): HIV: NONREACTIVE

## 2016-01-24 NOTE — Addendum Note (Signed)
Addended by: Roena MaladyEVONTENNO, Chaka Jefferys Y on: 01/24/2016 11:34 AM   Modules accepted: Orders

## 2016-01-25 NOTE — Addendum Note (Signed)
Addended by: Lorre MunroeBAITY, REGINA W on: 01/25/2016 11:53 AM   Modules accepted: Kipp BroodSmartSet

## 2016-01-31 ENCOUNTER — Telehealth: Payer: Self-pay | Admitting: Internal Medicine

## 2016-01-31 NOTE — Telephone Encounter (Signed)
Patient returned Melanie's call. I let her know her pathology results and she said she's doing fine.  I let her know she needed a 30 minute office visit to discuss her labs, but I didn't tell her she has diabetes.  Patient scheduled the appointment on 02/02/16.

## 2016-02-02 ENCOUNTER — Ambulatory Visit (INDEPENDENT_AMBULATORY_CARE_PROVIDER_SITE_OTHER): Payer: BC Managed Care – PPO | Admitting: Internal Medicine

## 2016-02-02 ENCOUNTER — Encounter: Payer: Self-pay | Admitting: Internal Medicine

## 2016-02-02 VITALS — BP 132/88 | HR 80 | Temp 98.2°F | Wt 280.0 lb

## 2016-02-02 DIAGNOSIS — E119 Type 2 diabetes mellitus without complications: Secondary | ICD-10-CM | POA: Insufficient documentation

## 2016-02-02 NOTE — Assessment & Plan Note (Signed)
Discussed diabetes and standards of medical care She is not interested in medication at this time She would like to try 3 months of lifestyle changes first She declines referral to a nutritionist at this time  RTC in 3 months for lab only to repeat A1C

## 2016-02-02 NOTE — Progress Notes (Signed)
Pre visit review using our clinic review tool, if applicable. No additional management support is needed unless otherwise documented below in the visit note. 

## 2016-02-02 NOTE — Patient Instructions (Signed)
Diabetes and Standards of Medical Care Diabetes is complicated. You may find that your diabetes team includes a dietitian, nurse, diabetes educator, eye doctor, and more. To help everyone know what is going on and to help you get the care you deserve, the following schedule of care was developed to help keep you on track. Below are the tests, exams, vaccines, medicines, education, and plans you will need. HbA1c test This test shows how well you have controlled your glucose over the past 2-3 months. It is used to see if your diabetes management plan needs to be adjusted.   It is performed at least 2 times a year if you are meeting treatment goals.  It is performed 4 times a year if therapy has changed or if you are not meeting treatment goals. Blood pressure test  This test is performed at every routine medical visit. The goal is less than 140/90 mm Hg for most people, but 130/80 mm Hg in some cases. Ask your health care provider about your goal. Dental exam  Follow up with the dentist regularly. Eye exam  If you are diagnosed with type 1 diabetes as a child, get an exam upon reaching the age of 80 years or older and having had diabetes for 3-5 years. Yearly eye exams are recommended after that initial eye exam.  If you are diagnosed with type 1 diabetes as an adult, get an exam within 5 years of diagnosis and then yearly.  If you are diagnosed with type 2 diabetes, get an exam as soon as possible after the diagnosis and then yearly. Foot care exam  Visual foot exams are performed at every routine medical visit. The exams check for cuts, injuries, or other problems with the feet.  You should have a complete foot exam performed every year. This exam includes an inspection of the structure and skin of your feet, a check of the pulses in your feet, and a check of the sensation in your feet.  Type 1 diabetes: The first exam is performed 5 years after diagnosis.  Type 2 diabetes: The first  exam is performed at the time of diagnosis.  Check your feet nightly for cuts, injuries, or other problems with your feet. Tell your health care provider if anything is not healing. Kidney function test (urine microalbumin)  This test is performed once a year.  Type 1 diabetes: The first test is performed 5 years after diagnosis.  Type 2 diabetes: The first test is performed at the time of diagnosis.  A serum creatinine and estimated glomerular filtration rate (eGFR) test is done once a year to assess the level of chronic kidney disease (CKD), if present. Lipid profile (cholesterol, HDL, LDL, triglycerides)  Performed every 5 years for most people.  The goal for LDL is less than 100 mg/dL. If you are at high risk, the goal is less than 70 mg/dL.  The goal for HDL is 40 mg/dL-50 mg/dL for men and 50 mg/dL-60 mg/dL for women. An HDL cholesterol of 60 mg/dL or higher gives some protection against heart disease.  The goal for triglycerides is less than 150 mg/dL. Immunizations  The flu (influenza) vaccine is recommended yearly for every person 30 months of age or older who has diabetes.  The pneumonia (pneumococcal) vaccine is recommended for every person 38 years of age or older who has diabetes. Adults 57 years of age or older may receive the pneumonia vaccine as a series of two separate shots.  The hepatitis B  vaccine is recommended for adults shortly after they have been diagnosed with diabetes.  The Tdap (tetanus, diphtheria, and pertussis) vaccine should be given:  According to normal childhood vaccination schedules, for children.  Every 10 years, for adults who have diabetes. Diabetes self-management education  Education is recommended at diagnosis and ongoing as needed. Treatment plan  Your treatment plan is reviewed at every medical visit.   This information is not intended to replace advice given to you by your health care provider. Make sure you discuss any questions you  have with your health care provider.   Document Released: 08/18/2009 Document Revised: 11/11/2014 Document Reviewed: 03/23/2013 Elsevier Interactive Patient Education 2016 [employer]  

## 2016-02-02 NOTE — Progress Notes (Signed)
Subjective:    Patient ID: Miranda [name], female    DOB: 10/12/1981, 35 y.o.   MRN: [id number]  HPI  Pt presents to the clinic today to follow up labs. She had an A1C of 6.6%. She has never been told that she has diabetes before. Her sister has diabetes. She reports her diet is poor and she does not exercise. She denies blurred vision, increased thirst, frequent urination, numbness or tingling in her lower extremities.  Review of Systems      Past Medical History  Diagnosis Date  . Obesity     No current outpatient prescriptions on file.   No current facility-administered medications for this visit.    No Known Allergies  Family History  Problem Relation Age of Onset  . Adopted: Yes  . Breast cancer Mother [id number]    Social History   Social History  . Marital Status: Single    Spouse Name: N/A  . Number of Children: N/A  . Years of Education: N/A   Occupational History  . Not on file.   Social History Main Topics  . Smoking status: Never Smoker   . Smokeless tobacco: Never Used  . Alcohol Use: No  . Drug Use: No  . Sexual Activity: No   Other Topics Concern  . Not on file   Social History Narrative     Constitutional: Denies fever, malaise, fatigue, headache or abrupt weight changes.  HEENT: Denies eye pain, eye redness, ear pain, ringing in the ears, wax buildup, runny nose, nasal congestion, bloody nose, or sore throat. Respiratory: Denies difficulty breathing, shortness of breath, cough or sputum production.   Cardiovascular: Denies chest pain, chest tightness, palpitations or swelling in the hands or feet.  GU: Denies urgency, frequency, pain with urination, burning sensation, blood in urine, odor or discharge. Skin: Denies redness, rashes, lesions or ulcercations.  Neurological: Denies dizziness, difficulty with memory, difficulty with speech or problems with balance and coordination.    No other specific complaints in a complete review of systems  (except as listed in HPI above).  Objective:   Physical Exam  BP 132/88 mmHg  Pulse 80  Temp(Src) 98.2 F (36.8 C) (Oral)  Wt 280 lb (127.007 kg)  SpO2 98%  LMP 01/17/2016 Wt Readings from Last 3 Encounters:  02/02/16 280 lb (127.007 kg)  01/23/16 279 lb (126.554 kg)  12/25/15 275 lb (124.739 kg)    General: Appears her stated age, obese in NAD. Cardiovascular: Normal rate and rhythm. S1,S2 noted.  No murmur, rubs or gallops noted.  Pulmonary/Chest: Normal effort and positive vesicular breath sounds. No respiratory distress. No wheezes, rales or ronchi noted.  Neurological: Alert and oriented.   BMET    Component Value Date/Time   NA 138 01/23/2016 1429   K 3.7 01/23/2016 1429   CL 106 01/23/2016 1429   CO2 26 01/23/2016 1429   GLUCOSE 96 01/23/2016 1429   BUN 9 01/23/2016 1429   CREATININE 0.65 01/23/2016 1429   CALCIUM 9.0 01/23/2016 1429   GFRNONAA >60 11/08/2015 1502   GFRAA >60 11/08/2015 1502    Lipid Panel     Component Value Date/Time   CHOL 158 01/23/2016 1429   TRIG 50.0 01/23/2016 1429   HDL 54.40 01/23/2016 1429   CHOLHDL 3 01/23/2016 1429   VLDL 10.0 01/23/2016 1429   LDLCALC 94 01/23/2016 1429    CBC    Component Value Date/Time   WBC 7.3 01/23/2016 1429   RBC 4.38 01/23/2016 1429  HGB 9.1* 01/23/2016 1429   HCT 29.4* 01/23/2016 1429   PLT 446.0* 01/23/2016 1429   MCV 67.0 Repeated and verified X2.* 01/23/2016 1429   MCH 20.5* 11/08/2015 1502   MCHC 30.8 01/23/2016 1429   RDW 20.8* 01/23/2016 1429   LYMPHSABS 2.0 06/23/2009 1950   MONOABS 0.6 06/23/2009 1950   EOSABS 0.1 06/23/2009 1950   BASOSABS 0.1 06/23/2009 1950    Hgb A1C Lab Results  Component Value Date   HGBA1C 6.6* 01/23/2016         Assessment & Plan:

## 2016-02-23 ENCOUNTER — Ambulatory Visit: Payer: BC Managed Care – PPO | Admitting: Primary Care

## 2016-05-03 ENCOUNTER — Other Ambulatory Visit: Payer: BC Managed Care – PPO

## 2016-09-25 ENCOUNTER — Other Ambulatory Visit: Payer: Self-pay | Admitting: Internal Medicine

## 2016-09-25 DIAGNOSIS — Z1231 Encounter for screening mammogram for malignant neoplasm of breast: Secondary | ICD-10-CM

## 2016-10-31 ENCOUNTER — Ambulatory Visit
Admission: RE | Admit: 2016-10-31 | Discharge: 2016-10-31 | Disposition: A | Discharge status: Home / Self Care | Payer: BC Managed Care – PPO | Source: Ambulatory Visit | Attending: Internal Medicine | Admitting: Internal Medicine

## 2016-10-31 DIAGNOSIS — Z1231 Encounter for screening mammogram for malignant neoplasm of breast: Secondary | ICD-10-CM

## 2016-11-06 ENCOUNTER — Ambulatory Visit: Payer: BC Managed Care – PPO | Admitting: Family Medicine

## 2016-11-07 ENCOUNTER — Ambulatory Visit (INDEPENDENT_AMBULATORY_CARE_PROVIDER_SITE_OTHER): Payer: BC Managed Care – PPO | Admitting: Family Medicine

## 2016-11-07 ENCOUNTER — Encounter: Payer: Self-pay | Admitting: Family Medicine

## 2016-11-07 VITALS — BP 126/88 | HR 91 | Temp 98.2°F | Wt 280.2 lb

## 2016-11-07 DIAGNOSIS — Z803 Family history of malignant neoplasm of breast: Secondary | ICD-10-CM | POA: Diagnosis not present

## 2016-11-07 DIAGNOSIS — E119 Type 2 diabetes mellitus without complications: Secondary | ICD-10-CM | POA: Diagnosis not present

## 2016-11-07 DIAGNOSIS — J3489 Other specified disorders of nose and nasal sinuses: Secondary | ICD-10-CM | POA: Diagnosis not present

## 2016-11-07 NOTE — Progress Notes (Signed)
Pre visit review using our clinic review tool, if applicable. No additional management support is needed unless otherwise documented below in the visit note. 

## 2016-11-07 NOTE — Patient Instructions (Signed)
For nasal congestion you can use Afrin nasal spray for 3 days max, Sudafed, saline nasal spray (generic is fine for all). Take an over the counter, generic cetirizine (Zyrtec)- take for 2 weeks then daily as needed Drink enough fluids to make your urine light yellow. For fever/chill/muscle aches you can take over the counter acetaminophen or ibuprofen.  Please come back in if you are not better in 5-7 days or if you develop wheezing, shortness of breath or persistent vomiting.

## 2016-11-07 NOTE — Progress Notes (Signed)
Subjective:    Patient ID: Miranda [name], female    DOB: 31-Oct-1981, 36 y.o.   MRN: [id number]  HPI This is a 36 yo female who presents today with bilateral ear pain. Has had cold symptoms for 3 weeks, cough, runny nose. Clear nasal drainage. Cough improved. No fever.   Was diagnosed with diabetes 3/17- declined medication. Has not lost any weight. Has been working out 5x a week for 4 weeks. According to her scale at home she has lost between 5-10 pounds. She works as Psychiatrist. She finds it difficult to find time for meal prep. She usually skips breakfast and lunch and eats fast food for dinner.   She has a family history of breast cancer, her mother was 77 when she died. The patient has had a negative mammogram and she is interested in genetic counseling to help her understand her risk.   Past Medical History:  Diagnosis Date  . Obesity    No past surgical history on file. Family History  Problem Relation Age of Onset  . Adopted: Yes  . Breast cancer Mother 58   Social History  Substance Use Topics  . Smoking status: Never Smoker  . Smokeless tobacco: Never Used  . Alcohol use No      Review of Systems Per HPI    Objective:   Physical Exam  Constitutional: She is oriented to person, place, and time. She appears well-developed and well-nourished. No distress.  Morbidly obese.   HENT:  Head: Normocephalic and atraumatic.  Right Ear: Tympanic membrane, external ear and ear canal normal.  Left Ear: Tympanic membrane, external ear and ear canal normal.  Nose: Mucosal edema and rhinorrhea present. Right sinus exhibits no maxillary sinus tenderness and no frontal sinus tenderness. Left sinus exhibits no maxillary sinus tenderness and no frontal sinus tenderness.  Mouth/Throat: Uvula is midline and oropharynx is clear and moist.  Eyes: Conjunctivae are normal.  Neck: Normal range of motion. Neck supple.  Cardiovascular: Normal rate, regular rhythm and  normal heart sounds.   Pulmonary/Chest: Effort normal and breath sounds normal.  Lymphadenopathy:    She has no cervical adenopathy.  Neurological: She is alert and oriented to person, place, and time.  Skin: Skin is warm and dry. She is not diaphoretic.  Psychiatric: She has a normal mood and affect. Her behavior is normal. Judgment and thought content normal.  Vitals reviewed.     BP 126/88 (BP Location: Right Arm, Patient Position: Sitting, Cuff Size: Large)   Pulse 91   Temp 98.2 F (36.8 C) (Oral)   Wt 280 lb 4 oz (127.1 kg)   LMP  (LMP Unknown)   SpO2 99%   BMI 54.73 kg/m  Wt Readings from Last 3 Encounters:  11/07/16 280 lb 4 oz (127.1 kg)  02/02/16 280 lb (127 kg)  01/23/16 279 lb (126.6 kg)       Assessment & Plan:  1. Rhinorrhea - Patient instructions For nasal congestion you can use Afrin nasal spray for 3 days max, Sudafed, saline nasal spray (generic is fine for all). Take an over the counter, generic cetirizine (Zyrtec)- take for 2 weeks then daily as needed Drink enough fluids to make your urine light yellow. For fever/chill/muscle aches you can take over the counter acetaminophen or ibuprofen.  Please come back in if you are not better in 5-7 days or if you develop wheezing, shortness of breath or persistent vomiting.  2. Family history of breast cancer  in mother - Ambulatory referral to Oncology- genetic counseling  3. Diabetes mellitus without complication (HCC) - encouraged continued regular exercise and discussed ways to meal plan, meal prep - Hemoglobin A1c   Olean Reeeborah Gessner, FNP-BC  [facility]  11/09/2016 10:45 AM

## 2016-11-08 LAB — HEMOGLOBIN A1C: Hgb A1c MFr Bld: 6.3 % (ref 4.6–6.5)

## 2016-11-13 ENCOUNTER — Telehealth: Payer: Self-pay | Admitting: Genetic Counselor

## 2016-11-13 ENCOUNTER — Encounter: Payer: Self-pay | Admitting: Genetic Counselor

## 2016-11-13 NOTE — Telephone Encounter (Signed)
Pt confirmed appt, verified demo and insurance, mailed pt letter, and in basket referring provider appt date/time. °

## 2016-12-27 ENCOUNTER — Encounter: Payer: Self-pay | Admitting: Physician Assistant

## 2016-12-27 ENCOUNTER — Ambulatory Visit (INDEPENDENT_AMBULATORY_CARE_PROVIDER_SITE_OTHER): Payer: BC Managed Care – PPO | Admitting: Physician Assistant

## 2016-12-27 VITALS — BP 164/108 | HR 88 | Temp 98.2°F | Ht 60.0 in | Wt 280.4 lb

## 2016-12-27 DIAGNOSIS — M79601 Pain in right arm: Secondary | ICD-10-CM | POA: Diagnosis not present

## 2016-12-27 DIAGNOSIS — R030 Elevated blood-pressure reading, without diagnosis of hypertension: Secondary | ICD-10-CM | POA: Diagnosis not present

## 2016-12-27 DIAGNOSIS — F418 Other specified anxiety disorders: Secondary | ICD-10-CM | POA: Diagnosis not present
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
## 2016-12-27 NOTE — Patient Instructions (Addendum)
It was great meeting you today!  Continue to rest your arm. You may take Tylenol for the pain, please follow package directions, do not exceed recommended amount. I would avoid ibuprofen until your blood pressure is better. You may also ice your arm. If your pain changes in any way, please let us know.  [facility] for blood pressure re-check. If you develop any chest pain, shortness of breath, or other symptoms, go to the EMERGENCY ROOM.   Muscle Strain A muscle strain is an injury that occurs when a muscle is stretched beyond its normal length. Usually a small number of muscle fibers are torn when this happens. Muscle strain is rated in degrees. First-degree strains have the least amount of muscle fiber tearing and pain. Second-degree and third-degree strains have increasingly more tearing and pain. Usually, recovery from muscle strain takes 1-2 weeks. Complete healing takes 5-6 weeks. What are the causes? Muscle strain happens when a sudden, violent force placed on a muscle stretches it too far. This may occur with lifting, sports, or a fall. What increases the risk? Muscle strain is especially common in athletes. What are the signs or symptoms? At the site of the muscle strain, there may be:  Pain.  Bruising.  Swelling.  Difficulty using the muscle due to pain or lack of normal function. How is this diagnosed? Your health care provider will perform a physical exam and ask about your medical history. How is this treated? Often, the best treatment for a muscle strain is resting, icing, and applying cold compresses to the injured area. Follow these instructions at home:  Use the PRICE method of treatment to promote muscle healing during the first 2-3 days after your injury. The PRICE method involves:  Protecting the muscle from being injured again.  Restricting your activity and resting the injured body part.  Icing your injury. To do this, put ice in a  plastic bag. Place a towel between your skin and the bag. Then, apply the ice and leave it on from 15-20 minutes each hour. After the third day, switch to moist heat packs.  Apply compression to the injured area with a splint or elastic bandage. Be careful not to wrap it too tightly. This may interfere with blood circulation or increase swelling.  Elevate the injured body part above the level of your heart as often as you can.  Only take over-the-counter or prescription medicines for pain, discomfort, or fever as directed by your health care provider.  Warming up prior to exercise helps to prevent future muscle strains. Contact a health care provider if:  You have increasing pain or swelling in the injured area.  You have numbness, tingling, or a significant loss of strength in the injured area. This information is not intended to replace advice given to you by your health care provider. Make sure you discuss any questions you have with your health care provider. Document Released: 10/21/2005 Document Revised: 03/28/2016 Document Reviewed: 05/20/2013 Elsevier Interactive Patient Education  2017 [employer]

## 2016-12-27 NOTE — Progress Notes (Signed)
Pre visit review using our clinic review tool, if applicable. No additional management support is needed unless otherwise documented below in the visit note. 

## 2016-12-31 ENCOUNTER — Ambulatory Visit: Payer: BC Managed Care – PPO | Admitting: Internal Medicine

## 2017-01-17 ENCOUNTER — Encounter: Payer: Self-pay | Admitting: Genetic Counselor

## 2017-01-20 ENCOUNTER — Encounter: Payer: Self-pay | Admitting: Genetic Counselor

## 2017-01-20 ENCOUNTER — Ambulatory Visit (HOSPITAL_BASED_OUTPATIENT_CLINIC_OR_DEPARTMENT_OTHER): Payer: BC Managed Care – PPO | Admitting: Genetic Counselor

## 2017-01-20 ENCOUNTER — Other Ambulatory Visit: Payer: BC Managed Care – PPO

## 2017-01-20 DIAGNOSIS — Z315 Encounter for genetic counseling: Secondary | ICD-10-CM

## 2017-01-20 DIAGNOSIS — Z803 Family history of malignant neoplasm of breast: Secondary | ICD-10-CM

## 2017-01-20 NOTE — Progress Notes (Signed)
REFERRING PROVIDER: Jearld Fenton, NP [place], [place] [address]  PRIMARY PROVIDER:  Webb Silversmith, NP  PRIMARY REASON FOR VISIT:  1. Family history of breast cancer      HISTORY OF PRESENT ILLNESS:   Miranda [name], a 36 y.o. female, was seen for a [place] cancer genetics consultation at the request of Dr. Garnette Gunner due to a family history of cancer.  Miranda [name] presents to clinic today to discuss the possibility of a hereditary predisposition to cancer, genetic testing, and to further clarify her future cancer risks, as well as potential cancer risks for family members. Miranda [name] is a 36 y.o. female with no personal history of cancer.  Her biological mother was diagnosed and died of breast cancer at age 71.  The patient was 4 when this occurred.  She reports that her middle sister had genetic testing which was negative.  CANCER HISTORY:   No history exists.     HORMONAL RISK FACTORS:  Menarche was at age 58.  First live birth at age 27.  OCP use for approximately 0 years.  Ovaries intact: yes.  Hysterectomy: no.  Menopausal status: premenopausal.  HRT use: 0 years. Colonoscopy: no; not examined. Mammogram within the last year: yes. Number of breast biopsies: 0. Up to date with pelvic exams:  yes. Any excessive radiation exposure in the past:  no  Past Medical History:  Diagnosis Date  . Family history of breast cancer   . Obesity     History reviewed. No pertinent surgical history.  Social History   Social History  . Marital status: Single    Spouse name: N/A  . Number of children: N/A  . Years of education: N/A   Social History Main Topics  . Smoking status: Never Smoker  . Smokeless tobacco: Never Used  . Alcohol use No  . Drug use: No  . Sexual activity: No   Other Topics Concern  . None   Social History Narrative  . None     FAMILY HISTORY:  We obtained a detailed, 4-generation family history.  Significant diagnoses are listed  below: Family History  Problem Relation Age of Onset  . Adopted: Yes  . Breast cancer Mother 27  . Healthy Sister   . Healthy Daughter     The patient has a 7 YO daughter who is cancer free.  She has two full sisters who are cancer free.  Her middle sister reportedly had genetic testing in [place] which was negative.  The patient's mother was diagnosed and died of breast cancer at age 65.  At that time the patient and her sister were adopted out of the family, so family history information is limited.  The patient reports that her mother had approximately 51 sisters and 2 brothers, none that she is aware of that had cancer.  Her grandparents died of, she thinks, old age.  The patient's father is alive, but she does not have any information about him or his family.  Patient's maternal ancestors are of [place] American and Caucasian descent, and paternal ancestors are of African American descent. There is no reported Ashkenazi Jewish ancestry. There is no known consanguinity.  GENETIC COUNSELING ASSESSMENT: Miranda [name] is a 36 y.o. female with a family history of breast cancer which is somewhat suggestive of a hereditary breast cancer syndrome and predisposition to cancer. We, therefore, discussed and recommended the following at today's visit.   DISCUSSION: We discussed that about 5-10% of  breast cancer is hereditary, with most cases due to BRCA mutations.  We discussed that other genes can also be associated with hereditary breast cancer syndromes, including ATM, CHEK2 and PALB2.  Based on her being adopted and not having any family history information, we discussed performing a full panel to determine if there could be a risk for cancer that she is unaware of.  We reviewed the characteristics, features and inheritance patterns of hereditary cancer syndromes. We also discussed genetic testing, including the appropriate family members to test, the process of testing, insurance coverage and  turn-around-time for results. We discussed the implications of a negative, positive and/or variant of uncertain significant result. We recommended Miranda [name] pursue genetic testing for the Common Hereditary Cancer gene panel. The Hereditary Gene Panel offered by [facility] includes sequencing and/or deletion duplication testing of the following 43 genes: APC, ATM, AXIN2, BARD1, BMPR1A, BRCA1, BRCA2, BRIP1, CDH1, CDKN2A (p14ARF), CDKN2A (p16INK4a), CHEK2, DICER1, EPCAM (Deletion/duplication testing only), GREM1 (promoter region deletion/duplication testing only), KIT, MEN1, MLH1, MSH2, MSH6, MUTYH, NBN, NF1, PALB2, PDGFRA, PMS2, POLD1, POLE, PTEN, RAD50, RAD51C, RAD51D, SDHB, SDHC, SDHD, SMAD4, SMARCA4. STK11, TP53, TSC1, TSC2, and VHL.  The following gene was evaluated for sequence changes only: SDHA and HOXB13 c.251G>A variant only.   Based on Miranda [name]'s family history of cancer, she meets medical criteria for genetic testing. Despite that she meets criteria, she may still have an out of pocket cost. We discussed that if her out of pocket cost for testing is over $100, the laboratory will call and confirm whether she wants to proceed with testing.  If the out of pocket cost of testing is less than $100 she will be billed by the genetic testing laboratory.   PLAN: After considering the risks, benefits, and limitations, Miranda [name]  provided informed consent to pursue genetic testing and the blood sample was sent to [facility] for analysis of the Common Hereditary Cancer panel. Results should be available within approximately 2-3 weeks' time, at which point they will be disclosed by telephone to Miranda [name], as will any additional recommendations warranted by these results. Miranda [name] will receive a summary of her genetic counseling visit and a copy of her results once available. This information will also be available in Epic. We encouraged Miranda [name] to remain in contact with cancer genetics  annually so that we can continuously update the family history and inform her of any changes in cancer genetics and testing that may be of benefit for her family. Miranda [name] questions were answered to her satisfaction today. Our contact information was provided should additional questions or concerns arise.  Lastly, we encouraged Miranda [name] to remain in contact with cancer genetics annually so that we can continuously update the family history and inform her of any changes in cancer genetics and testing that may be of benefit for this family.   Ms.  [name] questions were answered to her satisfaction today. Our contact information was provided should additional questions or concerns arise. Thank you for the referral and allowing [place] to share in the care of your patient.   Mickey Hebel P. Lowell Guitar, MS, [facility] Certified Genetic Counselor [email or web address] phone: [phone]  The patient was seen for a total of 45 minutes in face-to-face genetic counseling.  This patient was discussed with Drs. Magrinat, Pamelia Hoit and/or Mosetta Putt who agrees with the above.    _______________________________________________________________________ For Office Staff:  Number of people involved in session: 1 Was an Intern/ student involved with case: no

## 2017-01-27 ENCOUNTER — Telehealth: Payer: Self-pay | Admitting: Genetic Counselor

## 2017-01-27 ENCOUNTER — Encounter: Payer: Self-pay | Admitting: Genetic Counselor

## 2017-01-27 ENCOUNTER — Ambulatory Visit (INDEPENDENT_AMBULATORY_CARE_PROVIDER_SITE_OTHER): Payer: BC Managed Care – PPO | Admitting: Internal Medicine

## 2017-01-27 ENCOUNTER — Encounter: Payer: Self-pay | Admitting: Internal Medicine

## 2017-01-27 VITALS — BP 142/94 | HR 80 | Temp 98.6°F | Wt 269.0 lb

## 2017-01-27 DIAGNOSIS — Z01419 Encounter for gynecological examination (general) (routine) without abnormal findings: Secondary | ICD-10-CM | POA: Diagnosis not present

## 2017-01-27 DIAGNOSIS — Z113 Encounter for screening for infections with a predominantly sexual mode of transmission: Secondary | ICD-10-CM | POA: Diagnosis not present

## 2017-01-27 DIAGNOSIS — Z1379 Encounter for other screening for genetic and chromosomal anomalies: Secondary | ICD-10-CM | POA: Insufficient documentation

## 2017-01-27 DIAGNOSIS — Z124 Encounter for screening for malignant neoplasm of cervix: Secondary | ICD-10-CM

## 2017-01-27 DIAGNOSIS — R102 Pelvic and perineal pain: Secondary | ICD-10-CM

## 2017-01-27 NOTE — Patient Instructions (Signed)

## 2017-01-27 NOTE — Telephone Encounter (Signed)
LM on VM with good news.  Asked that she CB. 

## 2017-01-27 NOTE — Progress Notes (Signed)
Subjective:    Patient ID: Miranda [name], female    DOB: 01/26/1981, 36 y.o.   MRN: [id number]  HPI  Pt presents to the clinic today for her pap smear. Her last pap smear was in 2015. She is also requesting STD screening today as well. She has been c/o left side pelvic pain. This has been going on for months but worse in the last few weeks. She denies irregular periods but has had more intense cramping recently. She has not taken anything OTC for this.   Review of Systems      Past Medical History:  Diagnosis Date  . Family history of breast cancer   . Obesity     No current outpatient prescriptions on file.   No current facility-administered medications for this visit.     No Known Allergies  Family History  Problem Relation Age of Onset  . Adopted: Yes  . Breast cancer Mother 53  . Healthy Sister   . Healthy Daughter     Social History   Social History  . Marital status: Single    Spouse name: N/A  . Number of children: N/A  . Years of education: N/A   Occupational History  . Not on file.   Social History Main Topics  . Smoking status: Never Smoker  . Smokeless tobacco: Never Used  . Alcohol use No  . Drug use: No  . Sexual activity: No   Other Topics Concern  . Not on file   Social History Narrative  . No narrative on file     Gastrointestinal: Pt reports pelvic pain. Denies abdominal pain, bloating, constipation, diarrhea or blood in the stool.  GU: Denies urgency, frequency, pain with urination, burning sensation, blood in urine, odor or discharge.  No other specific complaints in a complete review of systems (except as listed in HPI above).  Objective:   Physical Exam   BP (!) 142/94   Pulse 80   Temp 98.6 F (37 C) (Oral)   Wt 269 lb (122 kg)   LMP 01/12/2017   BMI 52.54 kg/m  Wt Readings from Last 3 Encounters:  01/27/17 269 lb (122 kg)  12/27/16 280 lb 6.1 oz (127.2 kg)  11/07/16 280 lb 4 oz (127.1 kg)    General: Appears  her stated age, obese in NAD. Abdomen: Soft and tender in the left lower pelvic region. Normal bowel sounds.  Pelvic: Normal female anatomy. Scant amount of thin clear discharge noted. Cervix friable without noticeable changes. Adnexa palpable on the right, non palpable on the left.  BMET    Component Value Date/Time   NA 138 01/23/2016 1429   K 3.7 01/23/2016 1429   CL 106 01/23/2016 1429   CO2 26 01/23/2016 1429   GLUCOSE 96 01/23/2016 1429   BUN 9 01/23/2016 1429   CREATININE 0.65 01/23/2016 1429   CALCIUM 9.0 01/23/2016 1429   GFRNONAA >60 11/08/2015 1502   GFRAA >60 11/08/2015 1502    Lipid Panel     Component Value Date/Time   CHOL 158 01/23/2016 1429   TRIG 50.0 01/23/2016 1429   HDL 54.40 01/23/2016 1429   CHOLHDL 3 01/23/2016 1429   VLDL 10.0 01/23/2016 1429   LDLCALC 94 01/23/2016 1429    CBC    Component Value Date/Time   WBC 7.3 01/23/2016 1429   RBC 4.38 01/23/2016 1429   HGB 9.1 (L) 01/23/2016 1429   HCT 29.4 (L) 01/23/2016 1429   PLT 446.0 (H) 01/23/2016  1429   MCV 67.0 Repeated and verified X2. (L) 01/23/2016 1429   MCH 20.5 (L) 11/08/2015 1502   MCHC 30.8 01/23/2016 1429   RDW 20.8 (H) 01/23/2016 1429   LYMPHSABS 2.0 06/23/2009 1950   MONOABS 0.6 06/23/2009 1950   EOSABS 0.1 06/23/2009 1950   BASOSABS 0.1 06/23/2009 1950    Hgb A1C Lab Results  Component Value Date   HGBA1C 6.3 11/07/2016           Assessment & Plan:   Screening for STDS and for cervical cancer with routine gyn exam:  Pap today, will add STD screening HIV today  Pelvic Pain:  US pelvic and transvaginal ordered today  Will follow up after labs and imaging, make an appt for your annual exam Nicki ReaperBAITY, REGINA, NP

## 2017-01-28 ENCOUNTER — Other Ambulatory Visit (HOSPITAL_COMMUNITY)
Admission: RE | Admit: 2017-01-28 | Discharge: 2017-01-28 | Disposition: A | Discharge status: Home / Self Care | Payer: BC Managed Care – PPO | Source: Ambulatory Visit | Attending: Internal Medicine | Admitting: Internal Medicine

## 2017-01-28 DIAGNOSIS — Z124 Encounter for screening for malignant neoplasm of cervix: Secondary | ICD-10-CM | POA: Insufficient documentation

## 2017-01-28 DIAGNOSIS — Z113 Encounter for screening for infections with a predominantly sexual mode of transmission: Secondary | ICD-10-CM | POA: Diagnosis present

## 2017-01-28 LAB — HIV ANTIBODY (ROUTINE TESTING W REFLEX): HIV 1&2 Ab, 4th Generation: NONREACTIVE

## 2017-01-28 NOTE — Addendum Note (Signed)
Addended by: Roena MaladyEVONTENNO, Dotty Gonzalo Y on: 01/28/2017 01:25 PM   Modules accepted: Orders

## 2017-01-28 NOTE — Telephone Encounter (Signed)
LM on VM with good news. Asked that she please call back. 

## 2017-01-29 ENCOUNTER — Ambulatory Visit: Payer: Self-pay | Admitting: Genetic Counselor

## 2017-01-29 ENCOUNTER — Telehealth: Payer: Self-pay | Admitting: Genetic Counselor

## 2017-01-29 DIAGNOSIS — Z803 Family history of malignant neoplasm of breast: Secondary | ICD-10-CM

## 2017-01-29 DIAGNOSIS — Z1379 Encounter for other screening for genetic and chromosomal anomalies: Secondary | ICD-10-CM

## 2017-01-29 NOTE — Telephone Encounter (Signed)
Revealed negative genetic testing.  Discussed that we do not know why her mother had breast cancer so young. It could be due to a different gene that we are not testing, or maybe our current technology may not be able to pick something up.  It will be important for her to keep in contact with genetics to keep up with whether additional testing may be needed.  We recommend that her sisters each undergo genetic testing as well.

## 2017-01-29 NOTE — Progress Notes (Signed)
HPI: [name] was previously seen in the [place] clinic due to a family history of cancer and concerns regarding a hereditary predisposition to cancer. Please refer to our prior cancer genetics clinic note for more information regarding [name]'s medical, social and family histories, and our assessment and recommendations, at the time. [name] recent genetic test results were disclosed to her, as were recommendations warranted by these results. These results and recommendations are discussed in more detail below.  CANCER HISTORY:   No history exists.    FAMILY HISTORY:  We obtained a detailed, 4-generation family history.  Significant diagnoses are listed below: Family History  Problem Relation Age of Onset  . Adopted: Yes  . Breast cancer Mother 32  . Healthy Sister   . Healthy Daughter      The patient has a 42 YO daughter who is cancer free.  She has two full sisters who are cancer free.  Her middle sister reportedly had genetic testing in [place] which was negative.  The patient's mother was diagnosed and died of breast cancer at age 19.  At that time the patient and her sister were adopted out of the family, so family history information is limited.  The patient reports that her mother had approximately 32 sisters and 2 brothers, none that she is aware of that had cancer.  Her grandparents died of, she thinks, old age.  The patient's father is alive, but she does not have any information about him or his family.  Patient's maternal ancestors are of [place] American and Caucasian descent, and paternal ancestors are of African American descent. There is no reported Ashkenazi Jewish ancestry. There is no known consanguinity.  GENETIC TEST RESULTS: Genetic testing reported out on January 26, 2017 through the Common Hereditary cancer panel found no deleterious mutations.  The Hereditary Gene Panel offered by [facility] includes sequencing and/or deletion  duplication testing of the following 43 genes: APC, ATM, AXIN2, BARD1, BMPR1A, BRCA1, BRCA2, BRIP1, CDH1, CDKN2A (p14ARF), CDKN2A (p16INK4a), CHEK2, DICER1, EPCAM (Deletion/duplication testing only), GREM1 (promoter region deletion/duplication testing only), KIT, MEN1, MLH1, MSH2, MSH6, MUTYH, NBN, NF1, PALB2, PDGFRA, PMS2, POLD1, POLE, PTEN, RAD50, RAD51C, RAD51D, SDHB, SDHC, SDHD, SMAD4, SMARCA4. STK11, TP53, TSC1, TSC2, and VHL.  The following gene was evaluated for sequence changes only: SDHA and HOXB13 c.251G>A variant only.  The test report has been scanned into EPIC and is located under the Molecular Pathology section of the Results Review tab.   We discussed with [name] that since the current genetic testing is not perfect, it is possible there may be a gene mutation in one of these genes that current testing cannot detect, but that chance is small. We also discussed, that it is possible that another gene that has not yet been discovered, or that we have not yet tested, is responsible for the cancer diagnoses in the family, and it is, therefore, important to remain in touch with cancer genetics in the future so that we can continue to offer [name] the most up to date genetic testing.     CANCER SCREENING RECOMMENDATIONS:  This normal result is reassuring and indicates that [name] does not likely have an increased risk of cancer due to a mutation in one of these genes.  We, therefore, recommended  [name] continue to follow the cancer screening guidelines provided by her primary healthcare providers.   RECOMMENDATIONS FOR FAMILY MEMBERS: Women in this family might be at some increased risk  of developing cancer, over the general population risk, simply due to the family history of cancer. We recommended women in this family have a yearly mammogram beginning at age 8, or 79 years younger than the earliest onset of cancer, an annual clinical breast exam, and perform monthly breast  self-exams. Women in this family should also have a gynecological exam as recommended by their primary provider. All family members should have a colonoscopy by age 28.  FOLLOW-UP: Lastly, we discussed with [name] that cancer genetics is a rapidly advancing field and it is possible that new genetic tests will be appropriate for her and/or her family members in the future. We encouraged her to remain in contact with cancer genetics on an annual basis so we can update her personal and family histories and let her know of advances in cancer genetics that may benefit this family.   Our contact number was provided. [name] questions were answered to her satisfaction, and she knows she is welcome to call us at anytime with additional questions or concerns.   Roma Kayser, MS, [facility] Certified Genetic Counselor [email or web address]

## 2017-01-31 LAB — CYTOLOGY - PAP
Bacterial vaginitis: POSITIVE — AB
CANDIDA VAGINITIS: NEGATIVE
Chlamydia: NEGATIVE
DIAGNOSIS: NEGATIVE
HPV: NOT DETECTED
Neisseria Gonorrhea: NEGATIVE
TRICH (WINDOWPATH): NEGATIVE

## 2017-02-03 ENCOUNTER — Ambulatory Visit
Admission: RE | Admit: 2017-02-03 | Discharge: 2017-02-03 | Disposition: A | Discharge status: Home / Self Care | Payer: BC Managed Care – PPO | Source: Ambulatory Visit | Attending: Internal Medicine | Admitting: Internal Medicine

## 2017-02-03 DIAGNOSIS — R102 Pelvic and perineal pain: Secondary | ICD-10-CM

## 2017-02-04 ENCOUNTER — Other Ambulatory Visit: Payer: Self-pay | Admitting: Internal Medicine

## 2017-02-04 MED ORDER — METRONIDAZOLE 0.75 % VA GEL: 1.0000 | Freq: Two times a day (BID) | VAGINAL | 0 refills | Status: DC

## 2017-02-27 ENCOUNTER — Encounter (HOSPITAL_COMMUNITY): Payer: Self-pay

## 2017-04-02 ENCOUNTER — Telehealth: Payer: Self-pay | Admitting: Physician Assistant

## 2017-04-02 NOTE — Telephone Encounter (Signed)
[employer] called to get information about the patient's visit in order to close the claim. I transferred the call to Clovis CaoAutumn McNeil to advise to send the paperwork.

## 2018-02-03 ENCOUNTER — Encounter: Payer: Self-pay | Admitting: Internal Medicine

## 2018-02-03 ENCOUNTER — Ambulatory Visit: Payer: BC Managed Care – PPO | Admitting: Internal Medicine

## 2018-02-03 VITALS — BP 140/98 | HR 81 | Temp 98.5°F | Wt 275.0 lb

## 2018-02-03 DIAGNOSIS — F3281 Premenstrual dysphoric disorder: Secondary | ICD-10-CM

## 2018-02-03 MED ORDER — SERTRALINE HCL 25 MG PO TABS: 25.0000 mg | ORAL_TABLET | Freq: Every day | ORAL | 2 refills | Status: DC

## 2018-02-03 NOTE — Progress Notes (Signed)
Subjective:    Patient ID: Miranda [name], female    DOB: 10-Jul-1981, 37 y.o.   MRN: [id number]  HPI  Pt presents to the clinic today with c/o anxiety, moodiness and depression. She reports this started about 9 months ago. It only occurs about the 1 week before her period. She denies issues with mood for the rest of the month. She is not sure what is triggering this but thinks it is hormonal because it resolves as soon as her period starts. She has never had issues with PMS, PMDD, or anxiety/depression in the past.   Review of Systems      Past Medical History:  Diagnosis Date  . Family history of breast cancer   . Obesity     Current Outpatient Medications  Medication Sig Dispense Refill  . sertraline (ZOLOFT) 25 MG tablet Take 1 tablet (25 mg total) by mouth daily. For 10 days prior to period 30 tablet 2   No current facility-administered medications for this visit.     No Known Allergies  Family History  Adopted: Yes  Problem Relation Age of Onset  . Breast cancer Mother 5  . Healthy Sister   . Healthy Daughter     Social History   Socioeconomic History  . Marital status: Single    Spouse name: Not on file  . Number of children: Not on file  . Years of education: Not on file  . Highest education level: Not on file  Occupational History  . Not on file  Social Needs  . Financial resource strain: Not on file  . Food insecurity:    Worry: Not on file    Inability: Not on file  . Transportation needs:    Medical: Not on file    Non-medical: Not on file  Tobacco Use  . Smoking status: Never Smoker  . Smokeless tobacco: Never Used  Substance and Sexual Activity  . Alcohol use: No    Alcohol/week: 0.0 oz  . Drug use: No  . Sexual activity: Never    Birth control/protection: None  Lifestyle  . Physical activity:    Days per week: Not on file    Minutes per session: Not on file  . Stress: Not on file  Relationships  . Social connections:    Talks on  phone: Not on file    Gets together: Not on file    Attends religious service: Not on file    Active member of club or organization: Not on file    Attends meetings of clubs or organizations: Not on file    Relationship status: Not on file  . Intimate partner violence:    Fear of current or ex partner: Not on file    Emotionally abused: Not on file    Physically abused: Not on file    Forced sexual activity: Not on file  Other Topics Concern  . Not on file  Social History Narrative  . Not on file     Constitutional: Denies fever, malaise, fatigue, headache or abrupt weight changes.  Neurological: Denies dizziness, difficulty with memory, difficulty with speech or problems with balance and coordination.  Psych: Pt reports moodiness, anxiety, depression. Denies SI/HI.  No other specific complaints in a complete review of systems (except as listed in HPI above).  Objective:   Physical Exam  BP (!) 140/98   Pulse 81   Temp 98.5 F (36.9 C) (Oral)   Wt 275 lb (124.7 kg)   LMP  01/06/2018   SpO2 98%   BMI 53.71 kg/m  Wt Readings from Last 3 Encounters:  02/03/18 275 lb (124.7 kg)  01/27/17 269 lb (122 kg)  12/27/16 280 lb 6.1 oz (127.2 kg)    General: Appears her stated age, obese in NAD. Neurological: Alert and oriented. Psychiatric: Mood and affect mildly flat. Behavior is normal. Judgment and thought content normal.     BMET    Component Value Date/Time   NA 138 01/23/2016 1429   K 3.7 01/23/2016 1429   CL 106 01/23/2016 1429   CO2 26 01/23/2016 1429   GLUCOSE 96 01/23/2016 1429   BUN 9 01/23/2016 1429   CREATININE 0.65 01/23/2016 1429   CALCIUM 9.0 01/23/2016 1429   GFRNONAA >60 11/08/2015 1502   GFRAA >60 11/08/2015 1502    Lipid Panel     Component Value Date/Time   CHOL 158 01/23/2016 1429   TRIG 50.0 01/23/2016 1429   HDL 54.40 01/23/2016 1429   CHOLHDL 3 01/23/2016 1429   VLDL 10.0 01/23/2016 1429   LDLCALC 94 01/23/2016 1429    CBC      Component Value Date/Time   WBC 7.3 01/23/2016 1429   RBC 4.38 01/23/2016 1429   HGB 9.1 (L) 01/23/2016 1429   HCT 29.4 (L) 01/23/2016 1429   PLT 446.0 (H) 01/23/2016 1429   MCV 67.0 Repeated and verified X2. (L) 01/23/2016 1429   MCH 20.5 (L) 11/08/2015 1502   MCHC 30.8 01/23/2016 1429   RDW 20.8 (H) 01/23/2016 1429   LYMPHSABS 2.0 06/23/2009 1950   MONOABS 0.6 06/23/2009 1950   EOSABS 0.1 06/23/2009 1950   BASOSABS 0.1 06/23/2009 1950    Hgb A1C Lab Results  Component Value Date   HGBA1C 6.3 11/07/2016            Assessment & Plan:   PMDD:  Support offered today Discussed trying OCP's vs SSRI She would prefer to try SSRI for 10 days prior to period as opposed to daily eRx for Sertraline 25 mg daily 10 days prior to period  She will update me in 1-2 months and let me know how she is doing Nicki ReaperBAITY, REGINA, NP

## 2018-02-03 NOTE — Patient Instructions (Signed)
Premenstrual Syndrome Premenstrual syndrome (PMS) is a group of physical, emotional, and behavioral symptoms that affect women of childbearing age. PMS starts 1-2 weeks before the start of a woman's period and goes away a few days after the period starts. It often recurs in a predictable pattern. PMS can range from mild to severe. When it is severe, it is called premenstrual dysphoric disorder (PMDD). PMS can interfere in many ways with normal daily activities. What are the causes? The cause of this condition is not known, but it seems to be related to hormone changes that happen before menstruation. What are the signs or symptoms? Symptoms of this condition often happen every month. They go away completely after your period starts. Physical symptoms include:  Bloating.  Breast pain.  Headaches.  Extreme fatigue.  Backaches.  Swelling of the hands and feet.  Weight gain.  Hot flashes.  Emotional and behavioral symptoms include:  Mood swings.  Depression.  Angry outbursts.  Irritability.  Anxiety.  Crying spells.  Food cravings or appetite changes.  Changes in sexual desire.  Confusion.  Aggression.  Social withdrawal.  Poor concentration.  How is this diagnosed? This condition is diagnosed if symptoms of PMS:  Are present in the 5 days before your period starts.  End within 4 days after your period starts.  Happen at least 3 months in a row.  Interfere with some of your normal activities.  Other conditions that can cause some of these symptoms must be ruled out before PMS can be diagnosed. How is this treated? This condition may be treated by:  Maintaining a healthy lifestyle. This includes eating a balanced diet and exercising regularly.  Taking medicines. Medicines can help relieve symptoms such as cramps, aches, pains, headaches, and breast tenderness. Depending on the severity of the condition, your health care provider may  recommend: ? Over-the-counter pain medicines. ? Prescription medicines for PMDD.  Follow these instructions at home: Eating and drinking   Eat a well-balanced diet.  Avoid caffeine and alcohol.  Limit the amount of salt and salty foods you eat. This will help lessen bloating.  Drink enough fluid to keep your urine clear or pale yellow.  Take a multivitamin if told to by your health care provider. Lifestyle  Do not use any tobacco products, such as cigarettes, chewing tobacco, and e-cigarettes. If you need help quitting, ask your health care provider.  Exercise regularly as suggested by your health care provider.  Get enough sleep.  Practice relaxation techniques.  Limit stress. Other Instructions  For 2-3 months, write down your symptoms, their severity, and how long they last. This will help your health care provider choose the best treatment for you.  Take over-the-counter and prescription medicines only as told by your health care provider.  If you are using oral contraceptive pills, use them as told by your health care provider. This information is not intended to replace advice given to you by your health care provider. Make sure you discuss any questions you have with your health care provider. Document Released: 10/18/2000 Document Revised: 11/22/2015 Document Reviewed: 07/21/2015 Elsevier Interactive Patient Education  2018 Elsevier Inc.  

## 2018-03-10 ENCOUNTER — Other Ambulatory Visit: Payer: Self-pay | Admitting: Family Medicine

## 2018-03-10 ENCOUNTER — Encounter: Payer: Self-pay | Admitting: Family Medicine

## 2018-03-10 ENCOUNTER — Ambulatory Visit: Payer: Self-pay | Admitting: *Deleted

## 2018-03-10 ENCOUNTER — Ambulatory Visit: Payer: Self-pay | Admitting: Urgent Care

## 2018-03-10 ENCOUNTER — Ambulatory Visit: Payer: BC Managed Care – PPO | Admitting: Family Medicine

## 2018-03-10 VITALS — BP 136/90 | HR 92 | Wt 291.8 lb

## 2018-03-10 DIAGNOSIS — R030 Elevated blood-pressure reading, without diagnosis of hypertension: Secondary | ICD-10-CM

## 2018-03-10 DIAGNOSIS — Z803 Family history of malignant neoplasm of breast: Secondary | ICD-10-CM

## 2018-03-10 DIAGNOSIS — N644 Mastodynia: Secondary | ICD-10-CM

## 2018-03-10 NOTE — Assessment & Plan Note (Signed)
BP mildly elevated today with mild LE edema Discussed limiting sodium intake and recommend weight loss F/u with PCP for BP.

## 2018-03-10 NOTE — Progress Notes (Signed)
Miranda [name] - 37 y.o. female MRN [id number]  Date of birth: 26-Sep-1981  Subjective Chief Complaint  Patient presents with  . Breast Pain    HPI Miranda [name] is a 37 y.o. female here today with complaint of breast pain.  She states that pain has been off and on for the past few months with worsening over the past 1.5 weeks.  Her mother unfortunately developed breast cancer at age 62 and passed away from this.  She has had mammograms/ultrasounds in the past which were negative other than showing a possible cyst in the left breast.  She also has had negative genetic testing.  She denies any swelling of the breast, nipple discharge, lymph node enlargement, fever, chills, sob, or rash.    ROS: ROS completed and negative except as noted per HPI.   No Known Allergies  Past Medical History:  Diagnosis Date  . Family history of breast cancer   . Obesity     No past surgical history on file.  Social History   Socioeconomic History  . Marital status: Single    Spouse name: Not on file  . Number of children: Not on file  . Years of education: Not on file  . Highest education level: Not on file  Occupational History  . Not on file  Social Needs  . Financial resource strain: Not on file  . Food insecurity:    Worry: Not on file    Inability: Not on file  . Transportation needs:    Medical: Not on file    Non-medical: Not on file  Tobacco Use  . Smoking status: Never Smoker  . Smokeless tobacco: Never Used  Substance and Sexual Activity  . Alcohol use: No    Alcohol/week: 0.0 oz  . Drug use: No  . Sexual activity: Never    Birth control/protection: None  Lifestyle  . Physical activity:    Days per week: Not on file    Minutes per session: Not on file  . Stress: Not on file  Relationships  . Social connections:    Talks on phone: Not on file    Gets together: Not on file    Attends religious service: Not on file    Active member of club or organization: Not on file   Attends meetings of clubs or organizations: Not on file    Relationship status: Not on file  Other Topics Concern  . Not on file  Social History Narrative  . Not on file    Family History  Adopted: Yes  Problem Relation Age of Onset  . Breast cancer Mother 85  . Healthy Sister   . Healthy Daughter     Health Maintenance  Topic Date Due  . PNEUMOCOCCAL POLYSACCHARIDE VACCINE (1) 04/06/1984  . FOOT EXAM  04/06/1992  . OPHTHALMOLOGY EXAM  04/06/1992  . URINE MICROALBUMIN  04/06/1992  . TETANUS/TDAP  04/06/2001  . HEMOGLOBIN A1C  05/07/2017  . INFLUENZA VACCINE  06/04/2018  . PAP SMEAR  01/29/2020  . HIV Screening  Completed    ----------------------------------------------------------------------------------------------------------------------------------------------------------------------------------------------------------------- Physical Exam BP (!) 132/100 (BP Location: Right Arm, Patient Position: Sitting, Cuff Size: Large)   Pulse 92   Physical Exam  Constitutional: She is oriented to person, place, and time. She appears well-nourished. No distress.  HENT:  Head: Normocephalic and atraumatic.  Eyes: No scleral icterus.  Cardiovascular: Normal rate, regular rhythm and normal heart sounds.  Pulmonary/Chest: Effort normal and breath sounds normal. Right breast exhibits  no inverted nipple, no mass, no nipple discharge, no skin change and no tenderness. Left breast exhibits tenderness (tenderness outer quadrant). Left breast exhibits no inverted nipple, no mass, no nipple discharge and no skin change. No breast swelling. Breasts are symmetrical.  Chaperone: Torrie Holsey, RMA    Lymphadenopathy:    She has no axillary adenopathy.  Neurological: She is alert and oriented to person, place, and time.  Skin: No rash noted.  Psychiatric: She has a normal mood and affect. Her behavior is normal.     ------------------------------------------------------------------------------------------------------------------------------------------------------------------------------------------------------------------- Assessment and Plan  Elevated blood pressure reading BP mildly elevated today with mild LE edema Discussed limiting sodium intake and recommend weight loss F/u with PCP for BP.    Breast pain, left Family history of breast cancer at early age in mother Diagnostic mammogram of L breast ordered.

## 2018-03-10 NOTE — Telephone Encounter (Signed)
Pt called back and made appt

## 2018-03-10 NOTE — Assessment & Plan Note (Addendum)
Family history of breast cancer at early age in mother Diagnostic mammogram of L breast ordered.

## 2018-03-10 NOTE — Patient Instructions (Addendum)
I have ordered a mammogram for you  Your blood pressure is elevated today Watch your salt intake, this should help some with some of the swelling you are having as well Keep legs elevated if at rest.  Follow up with your primary care provider for your blood pressure

## 2018-03-11 ENCOUNTER — Ambulatory Visit: Payer: BC Managed Care – PPO | Admitting: Family Medicine

## 2018-03-13 ENCOUNTER — Ambulatory Visit
Admission: RE | Admit: 2018-03-13 | Discharge: 2018-03-13 | Disposition: A | Discharge status: Home / Self Care | Payer: BC Managed Care – PPO | Source: Ambulatory Visit | Attending: Family Medicine | Admitting: Family Medicine

## 2018-03-13 DIAGNOSIS — Z803 Family history of malignant neoplasm of breast: Secondary | ICD-10-CM

## 2018-03-13 DIAGNOSIS — N644 Mastodynia: Secondary | ICD-10-CM

## 2018-07-30 ENCOUNTER — Ambulatory Visit: Payer: BC Managed Care – PPO | Admitting: Family Medicine

## 2018-07-30 DIAGNOSIS — Z0289 Encounter for other administrative examinations: Secondary | ICD-10-CM

## 2018-07-31 ENCOUNTER — Encounter: Payer: Self-pay | Admitting: Family Medicine

## 2018-08-03 ENCOUNTER — Encounter: Payer: Self-pay | Admitting: Family Medicine

## 2018-08-03 ENCOUNTER — Ambulatory Visit: Payer: BC Managed Care – PPO | Admitting: Family Medicine

## 2018-08-03 VITALS — BP 138/80 | HR 70 | Temp 98.5°F | Ht 60.0 in | Wt 290.0 lb

## 2018-08-03 DIAGNOSIS — M436 Torticollis: Secondary | ICD-10-CM

## 2018-08-03 NOTE — Patient Instructions (Signed)
Acute Torticollis, Adult Torticollis is a condition in which the muscles of the neck tighten (contract) abnormally, causing the neck to twist and the head to move into an unnatural position. Torticollis that develops suddenly is called acute torticollis. People with acute torticollis may have trouble turning their head. The condition can be painful and may range from mild to severe. What are the causes? This condition may be caused by:  Sleeping in an awkward position (common).  Extending or twisting the neck muscles beyond their normal position.  An injury to the neck muscles.  An infection.  A tumor.  Certain medicines.  Long-lasting spasms of the neck muscles.  In some cases, the cause may not be known. What increases the risk? You are more likely to develop this condition if:  You have a condition associated with loose ligaments, such as Down syndrome.  You have a brain condition that affects vision, such as strabismus.  What are the signs or symptoms? The main symptom of this condition is tilting of the head to one side. Other symptoms include:  Pain in the neck.  Trouble turning the head from side to side or up and down.  How is this diagnosed? This condition may be diagnosed based on:  A physical exam.  Your medical history.  Imaging tests, such as: ? An X-ray. ? An ultrasound. ? A CT scan. ? An MRI.  How is this treated? Treatment for this condition depends on what is causing the condition. Mild cases may go away without treatment. Treatment for more serious cases may include:  Medicines or shots to relax the muscles.  Other medicines, such as antibiotics to treat the underlying cause.  Wearing a soft neck collar.  Physical therapy and stretching to improve neck strength and flexibility.  Neck massage.  In severe cases, surgery may be needed to repair dislocated or broken bones or to treat nerves in the neck. Follow these instructions at  home:  Take over-the-counter and prescription medicines only as told by your health care provider.  Do stretching exercises and massage your neck as told by your health care provider.  If directed, apply heat to the affected area as often as told by your health care provider. Use the heat source that your health care provider recommends, such as a moist heat pack or a heating pad. ? Place a towel between your skin and the heat source. ? Leave the heat on for 20-30 minutes. ? Remove the heat if your skin turns bright red. This is especially important if you are unable to feel pain, heat, or cold. You may have a greater risk of getting burned.  If you wake up with torticollis after sleeping, check your bed or sleeping area. Look for lumpy pillows or unusual objects. Make sure your bed and sleeping area are comfortable.  Keep all follow-up visits as told by your health care provider. This is important. Contact a health care provider if:  You have a fever.  Your symptoms do not improve or they get worse. Get help right away if:  You have trouble breathing.  You develop noisy breathing (stridor).  You start to drool.  You have trouble swallowing or pain when swallowing.  You develop numbness or weakness in your hands or feet.  You have changes in your speech, understanding, or vision.  You are in severe pain.  You cannot move your head or neck. Summary  Torticollis is a condition in which the muscles of the neck   tighten (contract) abnormally, causing the neck to twist and the head to move into an unnatural position. Torticollis that develops suddenly is called acute torticollis.  Treatment for this condition depends on what is causing the condition. Mild cases may go away without treatment.  Do stretching exercises and massage your neck as told by your health care provider. You may also be instructed to apply heat to the area.  Contact your health care provider if your symptoms  do not improve or they get worse. This information is not intended to replace advice given to you by your health care provider. Make sure you discuss any questions you have with your health care provider. Document Released: 10/18/2000 Document Revised: 12/19/2016 Document Reviewed: 12/19/2016 Elsevier Interactive Patient Education  2018 Elsevier Inc.  

## 2018-08-03 NOTE — Progress Notes (Signed)
   Subjective:    Patient ID: Miranda [name], female    DOB: 13-Jun-1981, 37 y.o.   MRN: [id number]  HPI This is a 37 yo female who presents today with neck pain. Started one week ago after sleeping on it wrong. Was seen at [facility] 07/30/18 and was diagnosed with acute torticollis and given presnisone taper, tramadol and baclofen. Has been taking medication as scheduled. No improvement at all in symptoms. Saw chiropractor same day and had some massage and acupuncture, given soft collar. Sleep is disturbed. Pain is constant with any movement. No problems moving left arm, pain down to forearm, no weakness, no numbness/tingling of arm. No headache. No leg weakness. She rates most severe pain at beginning as 10/10, was in tears. Now states pain is 6/6, "maybe it has gotten better."   Past Medical History:  Diagnosis Date  . Family history of breast cancer   . Obesity    No past surgical history on file. Family History  Adopted: Yes  Problem Relation Age of Onset  . Breast cancer Mother 73  . Healthy Sister   . Healthy Daughter    Social History   Tobacco Use  . Smoking status: Never Smoker  . Smokeless tobacco: Never Used  Substance Use Topics  . Alcohol use: No    Alcohol/week: 0.0 standard drinks  . Drug use: No      Review of Systems Per HPI    Objective:   Physical Exam  Constitutional: She is oriented to person, place, and time. She appears well-developed and well-nourished. No distress.  HENT:  Head: Normocephalic and atraumatic.  Eyes: Pupils are equal, round, and reactive to light. Conjunctivae and EOM are normal.  Neck: Normal range of motion.  Deliberate, slow but full ROM  Cardiovascular: Normal rate.  Pulmonary/Chest: Effort normal.  Musculoskeletal:       Cervical back: She exhibits tenderness (left trapezius). She exhibits no bony tenderness.  Left arm with full ROM, normal strength.  Gait normal.   Lymphadenopathy:    She has no cervical adenopathy.    Neurological: She is alert and oriented to person, place, and time. She displays normal reflexes. No cranial nerve deficit. She exhibits normal muscle tone. Coordination normal.  Skin: Skin is warm and dry. She is not diaphoretic.  Psychiatric: She has a normal mood and affect. Her behavior is normal. Judgment and thought content normal.  Vitals reviewed.     BP 138/80 (BP Location: Right Arm, Patient Position: Sitting, Cuff Size: Large)   Pulse 70   Temp 98.5 F (36.9 C) (Oral)   Ht 5' (1.524 m)   Wt 290 lb (131.5 kg)   SpO2 99%   BMI 56.64 kg/m  Wt Readings from Last 3 Encounters:  08/03/18 290 lb (131.5 kg)  03/10/18 291 lb 12.8 oz (132.4 kg)  02/03/18 275 lb (124.7 kg)       Assessment & Plan:  1. Torticollis -Provided written and verbal information regarding diagnosis and treatment. - She has had some improvement with prescribed treatment, was encouraged to finish prednisone taper as prescribed and use robaxin and tramadol sparingly as needed - Discussed expectations of treatment and encouraged her to apply heat and do gentle ROM several times a day - RTC precautions reviewed   Olean Ree, FNP-BC  [facility], [facility]  08/03/2018 1:23 PM

## 2019-01-14 ENCOUNTER — Encounter: Payer: Self-pay | Admitting: Family Medicine

## 2019-01-14 ENCOUNTER — Ambulatory Visit: Payer: BC Managed Care – PPO | Admitting: Family Medicine

## 2019-01-14 ENCOUNTER — Ambulatory Visit: Payer: BC Managed Care – PPO | Admitting: Internal Medicine

## 2019-01-14 ENCOUNTER — Other Ambulatory Visit: Payer: Self-pay

## 2019-01-14 ENCOUNTER — Telehealth: Payer: Self-pay

## 2019-01-14 VITALS — BP 136/98 | HR 86 | Temp 98.7°F | Ht 60.0 in | Wt 281.6 lb

## 2019-01-14 DIAGNOSIS — J209 Acute bronchitis, unspecified: Secondary | ICD-10-CM | POA: Insufficient documentation

## 2019-01-14 MED ORDER — AZITHROMYCIN 250 MG PO TABS: ORAL_TABLET | ORAL | 0 refills | Status: DC

## 2019-01-14 NOTE — Telephone Encounter (Signed)
Will see at scheduled appt

## 2019-01-14 NOTE — Telephone Encounter (Signed)
[facility] Night - Client TELEPHONE ADVICE RECORD [facility] Call Center Patient Name: Miranda [name] Gender: Female DOB: 1981/07/16 Age: 38 Y 9 M 9 D Return Phone Number: [phone] (Primary) Address: City/State/Zip: [place] [address] Client [facility] Night - Client Client Site [facility] - Night Physician [name] - NP Contact Type Call Who Is Calling Patient / Member / Family / Caregiver Call Type Triage / Clinical Relationship To Patient Self Return Phone Number [phone] (Primary) Chief Complaint Coughing Up Blood Reason for Call Symptomatic / Request for Health Information Initial Comment Caller states she has had a cough for 3 weeks now and she has coughed so much she is now coughing up blood. Translation No Nurse Assessment Nurse: Araceli Bouche, RN, Kitty Date/Time (Eastern Time): 01/13/2019 11:22:13 PM Confirm and document reason for call. If symptomatic, describe symptoms. ---Caller states that she has had a cough for 3 wks. Productive cough with large amount of green sputum with small amount of brown. Afebrile. States that her throat is sore but only because of the excessive coughing. Appetite normal. No vomiting or diarrhea. Has the patient traveled to [place], [place], [place], [place], or [place] OR had close contact with a person known to have the novel coronavirus illness in the last 14 days? ---No Does the patient have any new or worsening symptoms? ---Yes Will a triage be completed? ---Yes Related visit to physician within the last 2 weeks? ---No Does the PT have any chronic conditions? (i.e. diabetes, asthma, this includes High risk factors for pregnancy, etc.) ---No Is the patient pregnant or possibly pregnant? (Ask all females between the ages of 63-55) ---No Is this a behavioral health or substance abuse call? ---No Guidelines Guideline Title Affirmed Question Affirmed Notes  Nurse Date/Time (Eastern Time) Coughing Up Blood Coughing up rustycolored sputum Cubero, RN, Kitty 01/13/2019 11:26:24 PM Disp. Time Lamount Cohen Time) Disposition Final User 01/13/2019 11:32:28 PM See PCP within 24 Hours Yes Honeycutt, RN, Georgeann Oppenheim PLEASE NOTE: All timestamps contained within this report are represented as [place] Standard Time. CONFIDENTIALTY NOTICE: This fax transmission is intended only for the addressee. It contains information that is legally privileged, confidential or otherwise protected from use or disclosure. If you are not the intended recipient, you are strictly prohibited from reviewing, disclosing, copying using or disseminating any of this information or taking any action in reliance on or regarding this information. If you have received this fax in error, please notify us immediately by telephone so that we can arrange for its return to [place]. Phone: [phone], Toll-Free: [phone], Fax: [phone] Page: 2 of 2 Call Id: [id number] Caller Disagree/Comply Comply Caller Understands Yes PreDisposition Search internet for information Care Advice Given Per Guideline SEE PCP WITHIN 24 HOURS: * IF OFFICE WILL BE OPEN: You need to be seen within the next 24 hours. Call your doctor (or NP/PA) when the office opens and make an appointment. COUGH MEDICINES: * HOME REMEDY - HONEY: This old home remedy has been shown to help decrease coughing at night. The adult dosage is 2 teaspoons (10 ml) at bedtime. Honey should not be given to infants under one year of age. * OTC COUGH DROPS: Cough drops can help a lot, especially for mild coughs. They reduce coughing by soothing your irritated throat and removing that tickle sensation in the back of the throat. Cough drops also have the advantage of portability - you can carry them with you. * OTC COUGH SYRUPS: The  most common cough suppressant in OTC cough medications is dextromethorphan. Often the letters 'DM' appear in the name. *  HOME REMEDY - HARD CANDY: Hard candy works just as well as medicine-flavored OTC cough drops. People who have diabetes should use sugar-free candy. COUGHING SPASMS: Drink warm fluids. Inhale warm mist. (Reason: both relax the airway and loosen up the phlegm) Suck on cough drops or hard candy to coat the irritated throat. HUMIDIFIER: If the air is dry, use a humidifier in the bedroom. (Reason: dry air makes coughs worse) CALL BACK IF: * Difficulty breathing * You become worse. CARE ADVICE given per Coughing Up Blood guideline. Referrals REFERRED TO PCP OFFICE

## 2019-01-14 NOTE — Patient Instructions (Signed)

## 2019-01-14 NOTE — Progress Notes (Signed)
Miranda [name] - 38 y.o. female MRN [id number]  Date of birth: 1981/02/04  Subjective Chief Complaint  Patient presents with  . URI    0nset 3 wks, Cough/Cong/ yellow/green in chest , no fevers    HPI  Miranda [name] is a 38 y.o. female who complains of congestion, sore throat, cough described as productive of yellow, green and blood streaked sputum and chills for 21 days. She denies a history of chest pain, fevers, myalgias, nausea, shortness of breath, sweats, weakness and wheezing and denies a history of asthma. Patient does not smoke cigarettes.   ROS:  A comprehensive ROS was completed and negative except as noted per HPI   No Known Allergies  Past Medical History:  Diagnosis Date  . Family history of breast cancer   . Obesity     History reviewed. No pertinent surgical history.  Social History   Socioeconomic History  . Marital status: Single    Spouse name: Not on file  . Number of children: Not on file  . Years of education: Not on file  . Highest education level: Not on file  Occupational History  . Not on file  Social Needs  . Financial resource strain: Not on file  . Food insecurity:    Worry: Not on file    Inability: Not on file  . Transportation needs:    Medical: Not on file    Non-medical: Not on file  Tobacco Use  . Smoking status: Never Smoker  . Smokeless tobacco: Never Used  Substance and Sexual Activity  . Alcohol use: No    Alcohol/week: 0.0 standard drinks  . Drug use: No  . Sexual activity: Never    Birth control/protection: None  Lifestyle  . Physical activity:    Days per week: Not on file    Minutes per session: Not on file  . Stress: Not on file  Relationships  . Social connections:    Talks on phone: Not on file    Gets together: Not on file    Attends religious service: Not on file    Active member of club or organization: Not on file    Attends meetings of clubs or organizations: Not on file    Relationship status: Not on  file  Other Topics Concern  . Not on file  Social History Narrative  . Not on file    Family History  Adopted: Yes  Problem Relation Age of Onset  . Breast cancer Mother 43  . Healthy Sister   . Healthy Daughter     Health Maintenance  Topic Date Due  . PNEUMOCOCCAL POLYSACCHARIDE VACCINE AGE 98-64 HIGH RISK  04/06/1984  . FOOT EXAM  04/06/1992  . OPHTHALMOLOGY EXAM  04/06/1992  . URINE MICROALBUMIN  04/06/1992  . TETANUS/TDAP  04/06/2001  . HEMOGLOBIN A1C  05/07/2017  . INFLUENZA VACCINE  06/04/2018  . PAP SMEAR-Modifier  01/29/2020  . HIV Screening  Completed    ----------------------------------------------------------------------------------------------------------------------------------------------------------------------------------------------------------------- Physical Exam BP (!) 136/98   Pulse 86   Temp 98.7 F (37.1 C) (Oral)   Ht 5' (1.524 m)   Wt 281 lb 9.6 oz (127.7 kg)   SpO2 98%   BMI 55.00 kg/m   Physical Exam Constitutional:      Appearance: Normal appearance.  HENT:     Head: Normocephalic and atraumatic.     Right Ear: Tympanic membrane normal.     Left Ear: Tympanic membrane normal.     Nose: No congestion.  Mouth/Throat:     Mouth: Mucous membranes are moist.  Eyes:     General: No scleral icterus. Neck:     Musculoskeletal: Neck supple.  Cardiovascular:     Rate and Rhythm: Normal rate and regular rhythm.  Pulmonary:     Effort: Pulmonary effort is normal.     Breath sounds: Normal breath sounds.  Skin:    General: Skin is warm and dry.  Neurological:     General: No focal deficit present.     Mental Status: She is alert.  Psychiatric:        Mood and Affect: Mood normal.        Behavior: Behavior normal.     -------------------------------------------------------------------------------------------------------------------------------------------------------------------------------------------------------------------  Assessment and Plan  Acute bronchitis -Prolonged, worsening symptoms will cover with antibiotic -Rx for azithromycin -Increase fluid intake -Continue OTC supportive measures -F/u if symptoms continue to not improve or she has worsening of symptoms.

## 2019-01-14 NOTE — Telephone Encounter (Signed)
Pt already has appt 01/14/19 at 2 PM with Pamala Hurry NP.

## 2019-01-14 NOTE — Assessment & Plan Note (Signed)
-  Prolonged, worsening symptoms will cover with antibiotic -Rx for azithromycin -Increase fluid intake -Continue OTC supportive measures -F/u if symptoms continue to not improve or she has worsening of symptoms.

## 2019-01-14 NOTE — Progress Notes (Deleted)
  Subjective:     Miranda [name] is a 38 y.o. female who presents for evaluation of {uri rfv:14243::"symptoms of a URI"}. Symptoms include {uri sx:15453}. Onset of symptoms was {1-10:13787} {time; units:18646::"days"} ago, and has been {clinical course:17::"gradually worsening"} since that time. Treatment to date: {URI tx:14268}.  {Common ambulatory SmartLinks:19316}  Review of Systems {ros; complete:30496}   Objective:    {exam; complete:17964}   Assessment:    {uri dx:5273::"viral upper respiratory illness"}   Plan:    {plan; uri:14054::"Discussed diagnosis and treatment of URI.","Suggested symptomatic OTC remedies.","Nasal saline spray for congestion.","Follow up as needed."}

## 2019-06-15 ENCOUNTER — Other Ambulatory Visit: Payer: Self-pay

## 2019-06-15 ENCOUNTER — Ambulatory Visit (INDEPENDENT_AMBULATORY_CARE_PROVIDER_SITE_OTHER): Payer: BC Managed Care – PPO | Admitting: Internal Medicine

## 2019-06-15 ENCOUNTER — Encounter: Payer: Self-pay | Admitting: Internal Medicine

## 2019-06-15 VITALS — Ht 60.0 in | Wt 290.0 lb

## 2019-06-15 DIAGNOSIS — E119 Type 2 diabetes mellitus without complications: Secondary | ICD-10-CM | POA: Diagnosis not present

## 2019-06-15 DIAGNOSIS — Z0289 Encounter for other administrative examinations: Secondary | ICD-10-CM | POA: Diagnosis not present

## 2019-06-15 LAB — CBC
HCT: 29.8 % — ABNORMAL LOW (ref 36.0–46.0)
Hemoglobin: 8.8 g/dL — ABNORMAL LOW (ref 12.0–15.0)
MCHC: 29.5 g/dL — ABNORMAL LOW (ref 30.0–36.0)
MCV: 61.3 fl — ABNORMAL LOW (ref 78.0–100.0)
Platelets: 386 10*3/uL (ref 150.0–400.0)
RBC: 4.87 Mil/uL (ref 3.87–5.11)
RDW: 21.9 % — ABNORMAL HIGH (ref 11.5–15.5)
WBC: 5.3 10*3/uL (ref 4.0–10.5)

## 2019-06-15 LAB — MICROALBUMIN / CREATININE URINE RATIO
Creatinine,U: 478.6 mg/dL
Microalb Creat Ratio: 7.2 mg/g (ref 0.0–30.0)
Microalb, Ur: 34.7 mg/dL — ABNORMAL HIGH (ref 0.0–1.9)

## 2019-06-15 LAB — COMPREHENSIVE METABOLIC PANEL
ALT: 17 U/L (ref 0–35)
AST: 17 U/L (ref 0–37)
Albumin: 4.2 g/dL (ref 3.5–5.2)
Alkaline Phosphatase: 84 U/L (ref 39–117)
BUN: 6 mg/dL (ref 6–23)
CO2: 26 mEq/L (ref 19–32)
Calcium: 9.2 mg/dL (ref 8.4–10.5)
Chloride: 104 mEq/L (ref 96–112)
Creatinine, Ser: 0.8 mg/dL (ref 0.40–1.20)
GFR: 97.56 mL/min (ref >60.00)
Glucose, Bld: 118 mg/dL — ABNORMAL HIGH (ref 70–99)
Potassium: 3.9 mEq/L (ref 3.5–5.1)
Sodium: 137 mEq/L (ref 135–145)
Total Bilirubin: 0.4 mg/dL (ref 0.2–1.2)
Total Protein: 7.1 g/dL (ref 6.0–8.3)

## 2019-06-15 LAB — LIPID PANEL
Cholesterol: 166 mg/dL (ref 0–200)
HDL: 55.5 mg/dL (ref >39.00)
LDL Cholesterol: 98 mg/dL (ref 0–99)
NonHDL: 110.49
Total CHOL/HDL Ratio: 3
Triglycerides: 62 mg/dL (ref 0.0–149.0)
VLDL: 12.4 mg/dL (ref 0.0–40.0)

## 2019-06-15 LAB — HEMOGLOBIN A1C: Hgb A1c MFr Bld: 6.4 % (ref 4.6–6.5)

## 2019-06-15 NOTE — Addendum Note (Signed)
Addended by: Ellamae Sia on: 06/15/2019 12:01 PM   Modules accepted: Orders

## 2019-06-15 NOTE — Assessment & Plan Note (Signed)
CBC, CMET, Lipid, urine microalbumin and A1C today Encouraged her to consume a low carb diet and exercise for weight loss No diabetes meds at this time Encouraged yearly eye exam Encouraged routine foot exams She declines flu and pneumonia vaccines

## 2019-06-15 NOTE — Progress Notes (Signed)
Virtual Visit via Video Note  I connected with Miranda [name] on 06/15/19 at  9:00 AM EDT by a video enabled telemedicine application and verified that I am speaking with the correct person using two identifiers.  Location: Patient: Work Provider: Home   I discussed the limitations of evaluation and management by telemedicine and the availability of in person appointments. The patient expressed understanding and agreed to proceed.  History of Present Illness:  Pt requesting form for work accomodation. 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 She reports concerns that people are not social distancing. She reports with her history of obesity and borderline diabetes, she feels like she should be allowed to work virtually. She has not had labs in > 2 years. She is not currently on medications for diabetes. She would like to know if the form can just say "chronic conditions" instead of borderline diabetes for HIPPA reasons.  Past Medical History:  Diagnosis Date  . Family history of breast cancer   . Obesity     Current Outpatient Medications  Medication Sig Dispense Refill  . sertraline (ZOLOFT) 25 MG tablet Take 1 tablet (25 mg total) by mouth daily. For 10 days prior to period (Patient not taking: Reported on 06/15/2019) 30 tablet 2   No current facility-administered medications for this visit.     No Known Allergies  Family History  Adopted: Yes  Problem Relation Age of Onset  . Breast cancer Mother 4435  . Healthy Sister   . Healthy Daughter     Social History   Socioeconomic History  . Marital status: Single    Spouse name: Not on file  . Number of children: Not on file  . Years of education: Not on file  . Highest education level: Not on file  Occupational History  . Not on file  Social Needs  . Financial resource strain: Not on file  . Food insecurity    Worry: Not on file    Inability: Not on file  . Transportation needs    Medical: Not on file     Non-medical: Not on file  Tobacco Use  . Smoking status: Never Smoker  . Smokeless tobacco: Never Used  Substance and Sexual Activity  . Alcohol use: No    Alcohol/week: 0.0 standard drinks  . Drug use: No  . Sexual activity: Never    Birth control/protection: None  Lifestyle  . Physical activity    Days per week: Not on file    Minutes per session: Not on file  . Stress: Not on file  Relationships  . Social Musicianconnections    Talks on phone: Not on file    Gets together: Not on file    Attends religious service: Not on file    Active member of club or organization: Not on file    Attends meetings of clubs or organizations: Not on file    Relationship status: Not on file  . Intimate partner violence    Fear of current or ex partner: Not on file    Emotionally abused: Not on file    Physically abused: Not on file    Forced sexual activity: Not on file  Other Topics Concern  . Not on file  Social History Narrative  . Not on file     Constitutional: Denies fever, malaise, fatigue, headache or abrupt weight changes.  HEENT: Denies eye pain, eye redness, ear pain, ringing in the ears, wax buildup, runny  nose, nasal congestion, bloody nose, or sore throat. Respiratory: Denies difficulty breathing, shortness of breath, cough or sputum production.   Cardiovascular: Denies chest pain, chest tightness, palpitations or swelling in the hands or feet.  Skin: Denies redness, rashes, lesions or ulcercations.  Neurological: Denies numbness, tingling, burning of lower extremities.   No other specific complaints in a complete review of systems (except as listed in HPI above).  Observations/Objective:  Ht 5' (1.524 m)   Wt 290 lb (131.5 kg)   BMI 56.64 kg/m  Wt Readings from Last 3 Encounters:  06/15/19 290 lb (131.5 kg)  01/14/19 281 lb 9.6 oz (127.7 kg)  08/03/18 290 lb (131.5 kg)    General: Appears her stated age, obesity, in NAD. Skin: Warm, dry and intact. No lower  extremity ulcerations noted. Pulmonary/Chest: Normal effort. No respiratory distress.  Neurological: Alert and oriented. Sensation intact to BLE per her report.   BMET    Component Value Date/Time   NA 138 01/23/2016 1429   K 3.7 01/23/2016 1429   CL 106 01/23/2016 1429   CO2 26 01/23/2016 1429   GLUCOSE 96 01/23/2016 1429   BUN 9 01/23/2016 1429   CREATININE 0.65 01/23/2016 1429   CALCIUM 9.0 01/23/2016 1429   GFRNONAA >60 11/08/2015 1502   GFRAA >60 11/08/2015 1502    Lipid Panel     Component Value Date/Time   CHOL 158 01/23/2016 1429   TRIG 50.0 01/23/2016 1429   HDL 54.40 01/23/2016 1429   CHOLHDL 3 01/23/2016 1429   VLDL 10.0 01/23/2016 1429   LDLCALC 94 01/23/2016 1429    CBC    Component Value Date/Time   WBC 7.3 01/23/2016 1429   RBC 4.38 01/23/2016 1429   HGB 9.1 (L) 01/23/2016 1429   HCT 29.4 (L) 01/23/2016 1429   PLT 446.0 (H) 01/23/2016 1429   MCV 67.0 Repeated and verified X2. (L) 01/23/2016 1429   MCH 20.5 (L) 11/08/2015 1502   MCHC 30.8 01/23/2016 1429   RDW 20.8 (H) 01/23/2016 1429   LYMPHSABS 2.0 06/23/2009 1950   MONOABS 0.6 06/23/2009 1950   EOSABS 0.1 06/23/2009 1950   BASOSABS 0.1 06/23/2009 1950    Hgb A1C Lab Results  Component Value Date   HGBA1C 6.3 11/07/2016       Assessment and Plan:  Encounter for Form Completion, Obesity, Borderline DM 2:  Advised her to fax the form over to our office. Advised her to call and schedule a lab only appt for CBC, CMET, A1C Will let her know once labs are reviewed and form is ready for pickup.  Return precautions discussed  Follow Up Instructions:    I discussed the assessment and treatment plan with the patient. The patient was provided an opportunity to ask questions and all were answered. The patient agreed with the plan and demonstrated an understanding of the instructions.   The patient was advised to call back or seek an in-person evaluation if the symptoms worsen or if the  condition fails to improve as anticipated.     Webb Silversmith, NP

## 2019-06-15 NOTE — Patient Instructions (Signed)
COVID-19: How to Protect Yourself and Others Know how it spreads  There is currently no vaccine to prevent coronavirus disease 2019 (COVID-19).  The best way to prevent illness is to avoid being exposed to this virus.  The virus is thought to spread mainly from person-to-person. ? Between people who are in close contact with one another (within about 6 feet). ? Through respiratory droplets produced when an infected person coughs, sneezes or talks. ? These droplets can land in the mouths or noses of people who are nearby or possibly be inhaled into the lungs. ? Some recent studies have suggested that COVID-19 may be spread by people who are not showing symptoms. Everyone should Clean your hands often  Wash your hands often with soap and water for at least 20 seconds especially after you have been in a public place, or after blowing your nose, coughing, or sneezing.  If soap and water are not readily available, use a hand sanitizer that contains at least 60% alcohol. Cover all surfaces of your hands and rub them together until they feel dry.  Avoid touching your eyes, nose, and mouth with unwashed hands. Avoid close contact  Stay home if you are sick.  Avoid close contact with people who are sick.  Put distance between yourself and other people. ? Remember that some people without symptoms may be able to spread virus. ? This is especially important for people who are at higher risk of getting very sick.www.cdc.gov/coronavirus/2019-ncov/need-extra-precautions/people-at-higher-risk.html Cover your mouth and nose with a cloth face cover when around others  You could spread COVID-19 to others even if you do not feel sick.  Everyone should wear a cloth face cover when they have to go out in public, for example to the grocery store or to pick up other necessities. ? Cloth face coverings should not be placed on young children under age 2, anyone who has trouble breathing, or is unconscious,  incapacitated or otherwise unable to remove the mask without assistance.  The cloth face cover is meant to protect other people in case you are infected.  Do NOT use a facemask meant for a healthcare worker.  Continue to keep about 6 feet between yourself and others. The cloth face cover is not a substitute for social distancing. Cover coughs and sneezes  If you are in a private setting and do not have on your cloth face covering, remember to always cover your mouth and nose with a tissue when you cough or sneeze or use the inside of your elbow.  Throw used tissues in the trash.  Immediately wash your hands with soap and water for at least 20 seconds. If soap and water are not readily available, clean your hands with a hand sanitizer that contains at least 60% alcohol. Clean and disinfect  Clean AND disinfect frequently touched surfaces daily. This includes tables, doorknobs, light switches, countertops, handles, desks, phones, keyboards, toilets, faucets, and sinks. www.cdc.gov/coronavirus/2019-ncov/prevent-getting-sick/disinfecting-your-home.html  If surfaces are dirty, clean them: Use detergent or soap and water prior to disinfection.  Then, use a household disinfectant. You can see a list of EPA-registered household disinfectants here. cdc.gov/coronavirus 03/09/2019 This information is not intended to replace advice given to you by your health care provider. Make sure you discuss any questions you have with your health care provider. Document Released: 02/16/2019 Document Revised: 03/17/2019 Document Reviewed: 02/16/2019 Elsevier Patient Education  2020 Elsevier Inc.  

## 2019-06-17 ENCOUNTER — Encounter: Payer: Self-pay | Admitting: Internal Medicine

## 2019-06-22 ENCOUNTER — Ambulatory Visit: Payer: BC Managed Care – PPO

## 2019-06-22 ENCOUNTER — Other Ambulatory Visit: Payer: Self-pay

## 2019-06-22 VITALS — BP 136/84 | HR 97

## 2019-06-22 DIAGNOSIS — R030 Elevated blood-pressure reading, without diagnosis of hypertension: Secondary | ICD-10-CM

## 2019-06-22 NOTE — Progress Notes (Signed)
Pt stopped by the office for BP check per your request  136/84  HR 97

## 2019-06-23 NOTE — Progress Notes (Signed)
BP slightly elevated. Would recommend starting Losartan 25 mg daily in addition to low sat diet, exercise for weight loss. Follow up in 2 weeks for BP check.

## 2019-06-29 ENCOUNTER — Ambulatory Visit (INDEPENDENT_AMBULATORY_CARE_PROVIDER_SITE_OTHER): Payer: BC Managed Care – PPO | Admitting: Internal Medicine

## 2019-06-29 ENCOUNTER — Telehealth: Payer: Self-pay

## 2019-06-29 ENCOUNTER — Other Ambulatory Visit: Payer: Self-pay

## 2019-06-29 ENCOUNTER — Encounter: Payer: Self-pay | Admitting: Internal Medicine

## 2019-06-29 VITALS — BP 136/94 | HR 86 | Temp 98.8°F | Wt 294.0 lb

## 2019-06-29 DIAGNOSIS — N920 Excessive and frequent menstruation with regular cycle: Secondary | ICD-10-CM

## 2019-06-29 DIAGNOSIS — D649 Anemia, unspecified: Secondary | ICD-10-CM

## 2019-06-29 LAB — CBC
HCT: 25.2 % — ABNORMAL LOW (ref 36.0–46.0)
Hemoglobin: 7.5 g/dL — CL (ref 12.0–15.0)
MCHC: 29.7 g/dL — ABNORMAL LOW (ref 30.0–36.0)
MCV: 61.2 fl — ABNORMAL LOW (ref 78.0–100.0)
Platelets: 354 10*3/uL (ref 150.0–400.0)
RBC: 4.12 Mil/uL (ref 3.87–5.11)
RDW: 22.4 % — ABNORMAL HIGH (ref 11.5–15.5)
WBC: 5.6 10*3/uL (ref 4.0–10.5)

## 2019-06-29 LAB — POCT URINE PREGNANCY: Preg Test, Ur: NEGATIVE

## 2019-06-29 MED ORDER — TRANEXAMIC ACID 650 MG PO TABS: 1300.0000 mg | ORAL_TABLET | Freq: Three times a day (TID) | ORAL | 0 refills | Status: DC

## 2019-06-29 NOTE — Addendum Note (Signed)
Addended by: Jearld Fenton on: 06/29/2019 10:04 PM   Modules accepted: Orders

## 2019-06-29 NOTE — Telephone Encounter (Signed)
Pt notified. Will start oral iron, STAT referral to hematology placed

## 2019-06-29 NOTE — Telephone Encounter (Signed)
Miranda [name] reported a critical [name] result at 1555  Hemoglobin 7.5

## 2019-06-29 NOTE — Progress Notes (Signed)
Subjective:    Patient ID: Miranda [name], female    DOB: 01/09/1981, 38 y.o.   MRN: [id number]  HPI  Pt presents to the clinic today with c/o period lasting 3 weeks. She reports her period started 8/1. She has had heavy bleeding and large clots. She has never had issues with her menstrual cycle like this before. Her periods have always been regular. She recently became sexually active about 1 month ago. She is not using any protection or birth control. Her last pap was in 2018.    Review of Systems      Past Medical History:  Diagnosis Date  . Family history of breast cancer   . Obesity     Current Outpatient Medications  Medication Sig Dispense Refill  . sertraline (ZOLOFT) 25 MG tablet Take 1 tablet (25 mg total) by mouth daily. For 10 days prior to period (Patient not taking: Reported on 06/15/2019) 30 tablet 2   No current facility-administered medications for this visit.     No Known Allergies  Family History  Adopted: Yes  Problem Relation Age of Onset  . Breast cancer Mother 6435  . Healthy Sister   . Healthy Daughter     Social History   Socioeconomic History  . Marital status: Single    Spouse name: Not on file  . Number of children: Not on file  . Years of education: Not on file  . Highest education level: Not on file  Occupational History  . Not on file  Social Needs  . Financial resource strain: Not on file  . Food insecurity    Worry: Not on file    Inability: Not on file  . Transportation needs    Medical: Not on file    Non-medical: Not on file  Tobacco Use  . Smoking status: Never Smoker  . Smokeless tobacco: Never Used  Substance and Sexual Activity  . Alcohol use: No    Alcohol/week: 0.0 standard drinks  . Drug use: No  . Sexual activity: Never    Birth control/protection: None  Lifestyle  . Physical activity    Days per week: Not on file    Minutes per session: Not on file  . Stress: Not on file  Relationships  . Social  Musicianconnections    Talks on phone: Not on file    Gets together: Not on file    Attends religious service: Not on file    Active member of club or organization: Not on file    Attends meetings of clubs or organizations: Not on file    Relationship status: Not on file  . Intimate partner violence    Fear of current or ex partner: Not on file    Emotionally abused: Not on file    Physically abused: Not on file    Forced sexual activity: Not on file  Other Topics Concern  . Not on file  Social History Narrative  . Not on file     Constitutional: Denies fever, malaise, fatigue, headache or abrupt weight changes.  Respiratory: Denies difficulty breathing, shortness of breath, cough or sputum production.   Cardiovascular: Denies chest pain, chest tightness, palpitations or swelling in the hands or feet.  Gastrointestinal: Denies abdominal pain, bloating, constipation, diarrhea or blood in the stool.  GU: Pt reports prolonged menstrual bleeding. Denies urgency, frequency, pain with urination, burning sensation, blood in urine, odor or discharge.  No other specific complaints in a complete review of systems (except  as listed in HPI above).  Objective:   Physical Exam   BP (!) 136/94   Pulse 86   Temp 98.8 F (37.1 C) (Temporal)   Wt 294 lb (133.4 kg)   LMP 06/07/2019   BMI 57.42 kg/m  Wt Readings from Last 3 Encounters:  06/29/19 294 lb (133.4 kg)  06/15/19 290 lb (131.5 kg)  01/14/19 281 lb 9.6 oz (127.7 kg)    General: Appears her stated age, obese, in NAD. Cardiovascular: Normal rate and rhythm.  Pulmonary/Chest: Normal effort and positive vesicular breath sounds. No respiratory distress. No wheezes, rales or ronchi noted.  Abdomen: Soft and nontender. Normal bowel sounds. No distention or masses noted.  Pelvic: Deferred. Neurological: Alert and oriented.   BMET    Component Value Date/Time   NA 137 06/15/2019 1200   K 3.9 06/15/2019 1200   CL 104 06/15/2019 1200    CO2 26 06/15/2019 1200   GLUCOSE 118 (H) 06/15/2019 1200   BUN 6 06/15/2019 1200   CREATININE 0.80 06/15/2019 1200   CALCIUM 9.2 06/15/2019 1200   GFRNONAA >60 11/08/2015 1502   GFRAA >60 11/08/2015 1502    Lipid Panel     Component Value Date/Time   CHOL 166 06/15/2019 1200   TRIG 62.0 06/15/2019 1200   HDL 55.50 06/15/2019 1200   CHOLHDL 3 06/15/2019 1200   VLDL 12.4 06/15/2019 1200   LDLCALC 98 06/15/2019 1200    CBC    Component Value Date/Time   WBC 5.3 06/15/2019 1200   RBC 4.87 06/15/2019 1200   HGB 8.8 Repeated and verified X2. (L) 06/15/2019 1200   HCT 29.8 (L) 06/15/2019 1200   PLT 386.0 06/15/2019 1200   MCV 61.3 Repeated and verified X2. (L) 06/15/2019 1200   MCH 20.5 (L) 11/08/2015 1502   MCHC 29.5 (L) 06/15/2019 1200   RDW 21.9 (H) 06/15/2019 1200   LYMPHSABS 2.0 06/23/2009 1950   MONOABS 0.6 06/23/2009 1950   EOSABS 0.1 06/23/2009 1950   BASOSABS 0.1 06/23/2009 1950    Hgb A1C Lab Results  Component Value Date   HGBA1C 6.4 06/15/2019           Assessment & Plan:   Menorrhagia:  Urine Hcg: negative CBC today RX for Lysteda 1.3 g PO TID x 5 days Consider pelvic/transvaginal ultrasound if symptoms persist or worsen  Return precautions discussed Webb Silversmith, NP

## 2019-06-29 NOTE — Patient Instructions (Signed)
Menorrhagia Menorrhagia is when your menstrual periods are heavy or last longer than normal. Follow these instructions at home: Medicines   Take over-the-counter and prescription medicines exactly as told by your doctor. This includes iron pills.  Do not change or switch medicines without asking your doctor.  Do not take aspirin or medicines that contain aspirin 1 week before or during your period. Aspirin may make bleeding worse. General instructions  If you need to change your pad or tampon more than once every 2 hours, limit your activity until the bleeding stops.  Iron pills can cause problems when pooping (constipation). To prevent or treat pooping problems while taking prescription iron pills, your doctor may suggest that you: ? Drink enough fluid to keep your pee (urine) clear or pale yellow. ? Take over-the-counter or prescription medicines. ? Eat foods that are high in fiber. These foods include: ? Fresh fruits and vegetables. ? Whole grains. ? Beans. ? Limit foods that are high in fat and processed sugars. This includes fried and sweet foods.  Eat healthy meals and foods that are high in iron. Foods that have a lot of iron include: ? Leafy green vegetables. ? Meat. ? Liver. ? Eggs. ? Whole grain breads and cereals.  Do not try to lose weight until your heavy bleeding has stopped and you have normal amounts of iron in your blood. If you need to lose weight, work with your doctor.  Keep all follow-up visits as told by your doctor. This is important. Contact a doctor if:  You soak through a pad or tampon every 1 or 2 hours, and this happens every time you have a period.  You need to use pads and tampons at the same time because you are bleeding so much.  You are taking medicine and you: ? Feel sick to your stomach (nauseous). ? Throw up (vomit). ? Have watery poop (diarrhea).  You have other problems that may be related to the medicine you are taking. Get help  right away if:  You soak through more than a pad or tampon in 1 hour.  You pass clots bigger than 1 inch (2.5 cm) wide.  You feel short of breath.  You feel like your heart is beating too fast.  You feel dizzy or you pass out (faint).  You feel very weak or tired. Summary  Menorrhagia is when your menstrual periods are heavy or last longer than normal.  Take over-the-counter and prescription medicines exactly as told by your doctor. This includes iron pills.  Contact a doctor if you soak through more than a pad or tampon in 1 hour or are passing large clots. This information is not intended to replace advice given to you by your health care provider. Make sure you discuss any questions you have with your health care provider. Document Released: 07/30/2008 Document Revised: 01/28/2018 Document Reviewed: 11/11/2016 Elsevier Patient Education  2020 Elsevier Inc.  

## 2019-06-30 NOTE — Telephone Encounter (Signed)
Left detailed msg on VM per HIPAA  

## 2019-07-05 ENCOUNTER — Other Ambulatory Visit: Payer: Self-pay

## 2019-07-05 ENCOUNTER — Inpatient Hospital Stay: Payer: BC Managed Care – PPO | Attending: Oncology | Admitting: Oncology

## 2019-07-05 ENCOUNTER — Inpatient Hospital Stay: Payer: BC Managed Care – PPO

## 2019-07-05 ENCOUNTER — Encounter: Payer: Self-pay | Admitting: Oncology

## 2019-07-05 VITALS — BP 163/107 | HR 83 | Temp 97.8°F | Ht 62.99 in | Wt 303.5 lb

## 2019-07-05 DIAGNOSIS — D509 Iron deficiency anemia, unspecified: Secondary | ICD-10-CM

## 2019-07-05 DIAGNOSIS — R5383 Other fatigue: Secondary | ICD-10-CM | POA: Diagnosis not present

## 2019-07-05 DIAGNOSIS — N921 Excessive and frequent menstruation with irregular cycle: Secondary | ICD-10-CM

## 2019-07-05 DIAGNOSIS — N920 Excessive and frequent menstruation with regular cycle: Secondary | ICD-10-CM | POA: Diagnosis not present

## 2019-07-05 DIAGNOSIS — Z803 Family history of malignant neoplasm of breast: Secondary | ICD-10-CM | POA: Insufficient documentation

## 2019-07-05 DIAGNOSIS — D500 Iron deficiency anemia secondary to blood loss (chronic): Secondary | ICD-10-CM | POA: Diagnosis present

## 2019-07-05 DIAGNOSIS — R0609 Other forms of dyspnea: Secondary | ICD-10-CM | POA: Diagnosis not present

## 2019-07-05 LAB — CBC WITH DIFFERENTIAL/PLATELET
Abs Immature Granulocytes: 0.02 10*3/uL (ref 0.00–0.07)
Basophils Absolute: 0.1 10*3/uL (ref 0.0–0.1)
Basophils Relative: 1 %
Eosinophils Absolute: 0.2 10*3/uL (ref 0.0–0.5)
Eosinophils Relative: 2 %
HCT: 25.1 % — ABNORMAL LOW (ref 36.0–46.0)
Hemoglobin: 7.1 g/dL — ABNORMAL LOW (ref 12.0–15.0)
Immature Granulocytes: 0 %
Lymphocytes Relative: 30 %
Lymphs Abs: 2.1 10*3/uL (ref 0.7–4.0)
MCH: 18.2 pg — ABNORMAL LOW (ref 26.0–34.0)
MCHC: 28.3 g/dL — ABNORMAL LOW (ref 30.0–36.0)
MCV: 64.4 fL — ABNORMAL LOW (ref 80.0–100.0)
Monocytes Absolute: 0.7 10*3/uL (ref 0.1–1.0)
Monocytes Relative: 10 %
Neutro Abs: 3.8 10*3/uL (ref 1.7–7.7)
Neutrophils Relative %: 57 %
Platelets: 381 10*3/uL (ref 150–400)
RBC: 3.9 MIL/uL (ref 3.87–5.11)
RDW: 21.5 % — ABNORMAL HIGH (ref 11.5–15.5)
WBC: 6.8 10*3/uL (ref 4.0–10.5)
nRBC: 0 % (ref 0.0–0.2)

## 2019-07-05 LAB — IRON AND TIBC
Iron: 15 ug/dL — ABNORMAL LOW (ref 28–170)
Saturation Ratios: 4 % — ABNORMAL LOW (ref 10.4–31.8)
TIBC: 406 ug/dL (ref 250–450)
UIBC: 391 ug/dL

## 2019-07-05 LAB — TECHNOLOGIST SMEAR REVIEW: Plt Morphology: ADEQUATE

## 2019-07-05 LAB — RETIC PANEL
Immature Retic Fract: 39.3 % — ABNORMAL HIGH (ref 2.3–15.9)
RBC.: 3.9 MIL/uL (ref 3.87–5.11)
Retic Count, Absolute: 80.7 10*3/uL (ref 19.0–186.0)
Retic Ct Pct: 2.1 % (ref 0.4–3.1)
Reticulocyte Hemoglobin: 17.7 pg — ABNORMAL LOW (ref >27.9)

## 2019-07-05 LAB — FERRITIN: Ferritin: 3 ng/mL — ABNORMAL LOW (ref 11–307)

## 2019-07-05 NOTE — Progress Notes (Signed)
Here for initial evaluation for anemia.  Reports having her menstrual cycle for the whole month of August.  PCP did prescribe something to help stop the bleeding but she has not noticed any improvement and hemoglobin decreased more.  She is not taking an oral iron medication.  BP is elevated in office today and she denies any symptoms (163/107, 143/112, 159/109)

## 2019-07-06 ENCOUNTER — Encounter: Payer: Self-pay | Admitting: *Deleted

## 2019-07-06 NOTE — Progress Notes (Signed)
Hematology/Oncology Consult note [facility] Telephone:([phone] [phone]   Patient Care Team: Lorre MunroeBaity, Regina W, NP as PCP - General (Internal Medicine)  REFERRING PROVIDER: Lorre MunroeBaity, Regina W, NP  CHIEF COMPLAINTS/REASON FOR VISIT:  Evaluation of anemia  HISTORY OF PRESENTING ILLNESS:  Miranda [name] is a  38 y.o.  female with PMH listed below who was referred to me for evaluation of anemia Reviewed patient's recent labs that was done.  Labs revealed anemia with hemoglobin of 7.5, MCV 61.2 Reviewed patient's previous labs ordered by primary care physician's office, anemia is chronic onset , duration is since at least 2010.  She always had microcytic anemia.  Denies any family history of anemia or hemoglobinopathy No aggravating or improving factors.  Associated signs and symptoms: Patient reports fatigue.  Mild SOB with exertion, not associated with chest pain, nausea, vomiting..  Denies weight loss, easy bruising, hematochezia, hemoptysis, hematuria. Context: History of GI bleeding: Denies               History of Chronic kidney disease denies               History of autoimmune disease denies               History of hemolytic anemia.  Denies               Last colonoscopy: Report last colonoscopy was in 1998 results not available in EMR.               History of heavy menstrual period.  Reports heavy menstrual flow and currently her menstrual cycle has been lasting for the entire August.  Patient has been seen and evaluated by GYN and was started onLysteda 1300 mg 3 times daily.  She reports that her menstrual.  Has become lighter since the start of the medication.  Review of Systems  Constitutional: Positive for fatigue. Negative for appetite change, chills and fever.  HENT:   Negative for hearing loss and voice change.   Eyes: Negative for eye problems.  Respiratory: Positive for shortness of breath. Negative for chest tightness and cough.    Cardiovascular: Negative for chest pain.  Gastrointestinal: Negative for abdominal distention, abdominal pain and blood in stool.  Endocrine: Negative for hot flashes.  Genitourinary: Negative for difficulty urinating and frequency.   Musculoskeletal: Negative for arthralgias.  Skin: Negative for itching and rash.  Neurological: Negative for extremity weakness.  Hematological: Negative for adenopathy.  Psychiatric/Behavioral: Negative for confusion.     MEDICAL HISTORY:  Past Medical History:  Diagnosis Date  . Family history of breast cancer   . Obesity     SURGICAL HISTORY: History reviewed. No pertinent surgical history.  SOCIAL HISTORY: Social History   Socioeconomic History  . Marital status: Single    Spouse name: Not on file  . Number of children: Not on file  . Years of education: Not on file  . Highest education level: Not on file  Occupational History  . Not on file  Social Needs  . Financial resource strain: Not on file  . Food insecurity    Worry: Not on file    Inability: Not on file  . Transportation needs    Medical: Not on file    Non-medical: Not on file  Tobacco Use  . Smoking status: Never Smoker  . Smokeless tobacco: Never Used  Substance and Sexual Activity  . Alcohol use: No    Alcohol/week: 0.0 standard drinks  .  Drug use: No  . Sexual activity: Never    Birth control/protection: None  Lifestyle  . Physical activity    Days per week: Not on file    Minutes per session: Not on file  . Stress: Not on file  Relationships  . Social Musicianconnections    Talks on phone: Not on file    Gets together: Not on file    Attends religious service: Not on file    Active member of club or organization: Not on file    Attends meetings of clubs or organizations: Not on file    Relationship status: Not on file  . Intimate partner violence    Fear of current or ex partner: Not on file    Emotionally abused: Not on file    Physically abused: Not on file     Forced sexual activity: Not on file  Other Topics Concern  . Not on file  Social History Narrative  . Not on file    FAMILY HISTORY: Family History  Adopted: Yes  Problem Relation Age of Onset  . Breast cancer Mother 4735  . Healthy Sister   . Healthy Daughter     ALLERGIES:  has No Known Allergies.  MEDICATIONS:  Current Outpatient Medications  Medication Sig Dispense Refill  . tranexamic acid (LYSTEDA) 650 MG TABS tablet Take 2 tablets (1,300 mg total) by mouth 3 (three) times daily. 15 tablet 0   No current facility-administered medications for this visit.      PHYSICAL EXAMINATION: ECOG PERFORMANCE STATUS: 1 - Symptomatic but completely ambulatory Vitals:   07/05/19 0934 07/05/19 0935  BP: (!) 143/112 (!) 163/107  Pulse:    Temp:     Filed Weights   07/05/19 0928  Weight: (!) 303 lb 8 oz (137.7 kg)    Physical Exam Constitutional:      General: She is not in acute distress.    Appearance: She is obese.  HENT:     Head: Normocephalic and atraumatic.  Eyes:     General: No scleral icterus.    Pupils: Pupils are equal, round, and reactive to light.  Neck:     Musculoskeletal: Normal range of motion and neck supple.  Cardiovascular:     Rate and Rhythm: Normal rate and regular rhythm.     Heart sounds: Normal heart sounds.  Pulmonary:     Effort: Pulmonary effort is normal. No respiratory distress.     Breath sounds: No wheezing.  Abdominal:     General: Bowel sounds are normal. There is no distension.     Palpations: Abdomen is soft. There is no mass.     Tenderness: There is no abdominal tenderness.  Musculoskeletal: Normal range of motion.        General: No deformity.  Skin:    General: Skin is warm and dry.     Findings: No erythema or rash.  Neurological:     Mental Status: She is alert and oriented to person, place, and time.     Cranial Nerves: No cranial nerve deficit.     Coordination: Coordination normal.  Psychiatric:         Behavior: Behavior normal.        Thought Content: Thought content normal.      LABORATORY DATA:  I have reviewed the data as listed Lab Results  Component Value Date   WBC 6.8 07/05/2019   HGB 7.1 (L) 07/05/2019   HCT 25.1 (L) 07/05/2019   MCV 64.4 (L)  07/05/2019   PLT 381 07/05/2019   Recent Labs    06/15/19 1200  NA 137  K 3.9  CL 104  CO2 26  GLUCOSE 118*  BUN 6  CREATININE 0.80  CALCIUM 9.2  PROT 7.1  ALBUMIN 4.2  AST 17  ALT 17  ALKPHOS 84  BILITOT 0.4   Iron/TIBC/Ferritin/ %Sat    Component Value Date/Time   IRON 15 (L) 07/05/2019 1019   TIBC 406 07/05/2019 1019   FERRITIN 3 (L) 07/05/2019 1019   IRONPCTSAT 4 (L) 07/05/2019 1019    RADIOGRAPHIC STUDIES: I have personally reviewed the radiological images as listed and agreed with the findings in the report. No results found. 03/13/2018 bilateral diagnostic mammogram showed no mammographic or ultrasound evidence for malignancy.  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  Recommend screening mammogram in 1 year due to family history.    ASSESSMENT & PLAN:  1. Iron deficiency anemia due to chronic blood loss   2. Menorrhagia with irregular cycle   3. Family history of breast cancer    Anemia: multifactorial with possible causes including chronic blood loss, hyper/hypothyroidism, nutritional deficiency, infection/chronic inflammation, hemolysis, underlying bone marrow disorders. Labs reviewed and discussed with patient.  She has persistently chronic macrocytic anemia, combining her clinical history of irregular heavy menstrual flow, most likely she has iron deficiency anemia.  Hemoglobinopathy should also be considered. Recommend check CBC, iron, TIBC, ferritin, reticulocyte panel, smear, hemoglobinopathy evaluation.  We have also discussed that if blood work comes back that she is severely iron deficient, I will recommend IV iron.with Venofer 200mg  weekly x 4 doses.  Allergy reactions/infusion reaction including anaphylactic reaction discussed with patient. Other side effects include but not limited to high blood pressure, skin rash, weight gain, leg swelling, etc. Patient voices understanding and willing to proceed if needed.  Labs are reviewed.  Patient has progressively worsening hemoglobin now dropping to 7.1, iron study showed ferritin of 3 and iron saturation of 4.  Consistent with severe iron deficiency.  I would recommend IV Venofer treatments. Will reach out to patient and if she agrees we will arrange patient to have IV iron treatments.  Menorrhagia, irregular cycles, advised patient to continue follow-up with PCP.  She is Currently on Lysteda 1300 mg 3 times daily.  #Family history of breast cancer, patient has had diagnostic mammogram done in 2019 May.  Was recommended to have screening mammogram in 1 year.  She is due.  Recommend patient to follow-up with primary care physician for obtaining screening mammogram.  Orders Placed This Encounter  Procedures  . CBC with Differential/Platelet    Standing Status:   Future    Number of Occurrences:   1    Standing Expiration Date:   07/04/2020  . Technologist smear review    Standing Status:   Future    Number of Occurrences:   1    Standing Expiration Date:   07/04/2020  . Iron and TIBC    Standing Status:   Future    Number of Occurrences:   1    Standing Expiration Date:   07/04/2020  . Ferritin    Standing Status:   Future    Number of Occurrences:   1    Standing Expiration Date:   07/04/2020  . Hemoglobinopathy evaluation    Standing Status:   Future    Number of Occurrences:   1    Standing Expiration Date:   07/04/2020  .  Retic Panel    Standing Status:   Future    Number of Occurrences:   1    Standing Expiration Date:   07/04/2020    All questions were answered. The patient knows to call the clinic with any problems questions or concerns. Cc. Lorre Munroe, NP  Return of  visit: 8 weeks Thank you for this kind referral and the opportunity to participate in the care of this patient. A copy of today's note is routed to referring provider    Rickard Patience, MD, PhD 07/06/2019

## 2019-07-07 LAB — HEMOGLOBINOPATHY EVALUATION
Hgb A2 Quant: 1.4 % — ABNORMAL LOW (ref 1.8–3.2)
Hgb A: 98.6 % (ref 96.4–98.8)
Hgb C: 0 %
Hgb F Quant: 0 % (ref 0.0–2.0)
Hgb S Quant: 0 %
Hgb Variant: 0 %

## 2019-07-08 ENCOUNTER — Other Ambulatory Visit: Payer: Self-pay

## 2019-07-08 ENCOUNTER — Emergency Department (HOSPITAL_COMMUNITY)
Admission: EM | Admit: 2019-07-08 | Discharge: 2019-07-09 | Disposition: A | Discharge status: Home / Self Care | Payer: BC Managed Care – PPO | Attending: Emergency Medicine | Admitting: Emergency Medicine

## 2019-07-08 ENCOUNTER — Telehealth: Payer: Self-pay

## 2019-07-08 ENCOUNTER — Encounter (HOSPITAL_COMMUNITY): Payer: Self-pay

## 2019-07-08 DIAGNOSIS — Z6841 Body Mass Index (BMI) 40.0 and over, adult: Secondary | ICD-10-CM | POA: Diagnosis not present

## 2019-07-08 DIAGNOSIS — N939 Abnormal uterine and vaginal bleeding, unspecified: Secondary | ICD-10-CM | POA: Diagnosis not present

## 2019-07-08 DIAGNOSIS — D649 Anemia, unspecified: Secondary | ICD-10-CM | POA: Insufficient documentation

## 2019-07-08 DIAGNOSIS — E669 Obesity, unspecified: Secondary | ICD-10-CM | POA: Diagnosis not present

## 2019-07-08 DIAGNOSIS — E119 Type 2 diabetes mellitus without complications: Secondary | ICD-10-CM | POA: Insufficient documentation

## 2019-07-08 LAB — CBC
HCT: 26 % — ABNORMAL LOW (ref 36.0–46.0)
Hemoglobin: 7.1 g/dL — ABNORMAL LOW (ref 12.0–15.0)
MCH: 17.8 pg — ABNORMAL LOW (ref 26.0–34.0)
MCHC: 27.3 g/dL — ABNORMAL LOW (ref 30.0–36.0)
MCV: 65 fL — ABNORMAL LOW (ref 80.0–100.0)
Platelets: 377 10*3/uL (ref 150–400)
RBC: 4 MIL/uL (ref 3.87–5.11)
RDW: 21.6 % — ABNORMAL HIGH (ref 11.5–15.5)
WBC: 6.7 10*3/uL (ref 4.0–10.5)
nRBC: 0 % (ref 0.0–0.2)

## 2019-07-08 LAB — I-STAT BETA HCG BLOOD, ED (MC, WL, AP ONLY): I-stat hCG, quantitative: <5 m[IU]/mL (ref <5)

## 2019-07-08 NOTE — Telephone Encounter (Signed)
Pt saw R Baity on 06/29/19; now pt said clots  1/2 the size of pts hand coming out with bright red bleeding since the beginning of August. Pt finished the tranexamic acid 2 days ago and clotting was not as big but not the clotting is 1/2 the size of pts hand and bleeding heavy. Pt has abd pain on and off at lt ovary; pain level now is a throbbing pain at 3. Pt feeling weak and tired.Pt is working from home but is not keeping her feet up. Pt has no covid symptoms, no travel and no known exposure to + covid. Avie Echevaria NP said pt needs to go to ED for eval and possible transfusion. Pt said her mother will take her to [facility] ED now. Pt advised not to drive and pt voiced understanding and will have mom take pt to ED now.I called [facility] to let them know pt is coming; spoke with Maudie Mercury RN in ED 07/05/19 hgb 7.1 and iron level 15 and she voiced understanding.Marland Kitchen

## 2019-07-08 NOTE — ED Triage Notes (Signed)
Pt presents with ongoing vaginal bleeding x1 month, has seen a specialist for the same. Pt reports passing multiple clots today. PCP sent her here due to a Hgb of 7 a month ago.

## 2019-07-09 ENCOUNTER — Emergency Department (HOSPITAL_COMMUNITY): Payer: BC Managed Care – PPO

## 2019-07-09 LAB — WET PREP, GENITAL
Sperm: NONE SEEN
Trich, Wet Prep: NONE SEEN
Yeast Wet Prep HPF POC: NONE SEEN

## 2019-07-09 LAB — RPR: RPR Ser Ql: NONREACTIVE

## 2019-07-09 LAB — HIV ANTIBODY (ROUTINE TESTING W REFLEX): HIV Screen 4th Generation wRfx: NONREACTIVE

## 2019-07-09 MED ORDER — NORGESTIMATE-ETH ESTRADIOL 0.25-35 MG-MCG PO TABS: 1.0000 | ORAL_TABLET | Freq: Every day | ORAL | 11 refills | Status: DC

## 2019-07-09 MED ORDER — NORGESTIMATE-ETH ESTRADIOL 0.25-35 MG-MCG PO TABS: 3.0000 | ORAL_TABLET | Freq: Every day | ORAL | 0 refills | Status: DC

## 2019-07-09 NOTE — ED Provider Notes (Signed)
[facility] EMERGENCY DEPARTMENT Provider Note   CSN: [id number] Arrival date & time: 07/08/19  1813     History   Chief Complaint Chief Complaint  Patient presents with   Vaginal Bleeding    HPI Miranda [name] is a 38 y.o. female.     Seen by PCP, sent to hem/onc for vaginal bleeding. Last hb was 7.1 a few days ago. Has some decreased energy. No syncope, light headedness or DOE.   Vaginal Bleeding Quality:  Bright red and clots Severity:  Moderate Duration:  1 month Timing:  Constant Progression:  Worsening Chronicity:  New Menstrual history:  Regular Possible pregnancy: no   Context: not after intercourse   Relieved by:  None tried Worsened by:  Nothing Ineffective treatments:  None tried   Past Medical History:  Diagnosis Date   Family history of breast cancer    Obesity     Patient Active Problem List   Diagnosis Date Noted   New onset type 2 diabetes mellitus ([place]) 02/02/2016   Morbid obesity ([place]) 09/18/2015    History reviewed. No pertinent surgical history.   OB History   No obstetric history on file.      Home Medications    Prior to Admission medications   Medication Sig Start Date End Date Taking? Authorizing Provider  norgestimate-ethinyl estradiol (SPRINTEC 28) 0.25-35 MG-MCG tablet Take 3 tablets by mouth daily for 7 days. 07/09/19 07/16/19  Alenna Russell, Corene Cornea, MD  norgestimate-ethinyl estradiol ([place] 28) 0.25-35 MG-MCG tablet Take 1 tablet by mouth daily. 07/09/19   Quantarius Genrich, Corene Cornea, MD    Family History Family History  Adopted: Yes  Problem Relation Age of Onset   Breast cancer Mother 67   Healthy Sister    Healthy Daughter     Social History Social History   Tobacco Use   Smoking status: Never Smoker   Smokeless tobacco: Never Used  Substance Use Topics   Alcohol use: No    Alcohol/week: 0.0 standard drinks   Drug use: No     Allergies   Patient has no known allergies.   Review of  Systems Review of Systems  Genitourinary: Positive for vaginal bleeding.  All other systems reviewed and are negative.    Physical Exam Updated Vital Signs BP (!) 142/91 (BP Location: Right Arm)    Pulse 76    Temp 97.8 F (36.6 C) (Oral)    Resp 18    Ht 5' (1.524 m)    Wt (!) 137.4 kg    SpO2 100%    BMI 59.18 kg/m   Physical Exam Vitals signs and nursing note reviewed.  Constitutional:      Appearance: She is well-developed.  HENT:     Head: Normocephalic and atraumatic.     Mouth/Throat:     Pharynx: Oropharynx is clear.  Eyes:     Extraocular Movements: Extraocular movements intact.     Conjunctiva/sclera: Conjunctivae normal.  Neck:     Musculoskeletal: Normal range of motion.  Cardiovascular:     Rate and Rhythm: Normal rate and regular rhythm.  Pulmonary:     Effort: No respiratory distress.     Breath sounds: No stridor.  Abdominal:     General: Bowel sounds are normal. There is no distension.  Genitourinary:    Vagina: No signs of injury. Bleeding present. No vaginal discharge.     Uterus: Not deviated.      Adnexa: Right adnexa normal and left adnexa normal.  Musculoskeletal: Normal  range of motion.        General: No swelling or tenderness.  Skin:    General: Skin is warm and dry.  Neurological:     General: No focal deficit present.     Mental Status: She is alert.      ED Treatments / Results  Labs (all labs ordered are listed, but only abnormal results are displayed) Labs Reviewed  WET PREP, GENITAL - Abnormal; Notable for the following components:      Result Value   Clue Cells Wet Prep HPF POC PRESENT (*)    WBC, Wet Prep HPF POC MODERATE (*)    All other components within normal limits  CBC - Abnormal; Notable for the following components:   Hemoglobin 7.1 (*)    HCT 26.0 (*)    MCV 65.0 (*)    MCH 17.8 (*)    MCHC 27.3 (*)    RDW 21.6 (*)    All other components within normal limits  RPR  HIV ANTIBODY (ROUTINE TESTING W REFLEX)   I-STAT BETA HCG BLOOD, ED ([facility], [facility], [facility] ONLY)  GC/CHLAMYDIA PROBE AMP ([place]) NOT AT [facility]    EKG None  Radiology US Pelvis Transvaginal Non-ob (tv Only)  Result Date: 07/09/2019 CLINICAL DATA:  Vaginal bleeding EXAM: TRANSABDOMINAL AND TRANSVAGINAL ULTRASOUND OF PELVIS TECHNIQUE: Both transabdominal and transvaginal ultrasound examinations of the pelvis were performed. Transabdominal technique was performed for global imaging of the pelvis including uterus, ovaries, adnexal regions, and pelvic cul-de-sac. It was necessary to proceed with endovaginal exam following the transabdominal exam to visualize the ovaries. COMPARISON:  02/03/2017 FINDINGS: Uterus Measurements: 10.2 x 6.7 x 5.5 cm. = volume: 194 mL. Single uterine fibroid measuring 3.3 cm is noted posteriorly in the uterus. Endometrium Thickness: 15 mm.  No focal abnormality visualized. Right ovary Measurements: 7.8 x 6.1 x 6.5 cm. = volume: 162 mL. 6.3 cm cyst is noted within the right ovary. Left ovary Measurements: 2.9 x 1.7 x 2.1 cm. = volume: 5.4 mL. Normal appearance/no adnexal mass. Other findings No abnormal free fluid. IMPRESSION: Uterine fibroid stable from the prior examination. 6.3 cm cyst within the right ovary. This has benign characteristics. No imaging follow up is required for premenopausal females. This follows consensus guidelines: Simple Adnexal Cysts: SRU Consensus Conference Update on Follow-up and Reporting. Radiology 2019; [id number]. Electronically Signed   By: Alcide Clever M.D.   On: 07/09/2019 07:17   US Pelvis Complete  Result Date: 07/09/2019 CLINICAL DATA:  Vaginal bleeding EXAM: TRANSABDOMINAL AND TRANSVAGINAL ULTRASOUND OF PELVIS TECHNIQUE: Both transabdominal and transvaginal ultrasound examinations of the pelvis were performed. Transabdominal technique was performed for global imaging of the pelvis including uterus, ovaries, adnexal regions, and pelvic cul-de-sac. It was necessary to proceed with endovaginal  exam following the transabdominal exam to visualize the ovaries. COMPARISON:  02/03/2017 FINDINGS: Uterus Measurements: 10.2 x 6.7 x 5.5 cm. = volume: 194 mL. Single uterine fibroid measuring 3.3 cm is noted posteriorly in the uterus. Endometrium Thickness: 15 mm.  No focal abnormality visualized. Right ovary Measurements: 7.8 x 6.1 x 6.5 cm. = volume: 162 mL. 6.3 cm cyst is noted within the right ovary. Left ovary Measurements: 2.9 x 1.7 x 2.1 cm. = volume: 5.4 mL. Normal appearance/no adnexal mass. Other findings No abnormal free fluid. IMPRESSION: Uterine fibroid stable from the prior examination. 6.3 cm cyst within the right ovary. This has benign characteristics. No imaging follow up is required for premenopausal females. This follows consensus guidelines: Simple Adnexal  Cysts: SRU Consensus Conference Update on Follow-up and Reporting. Radiology 2019; [id number]. Electronically Signed   By: Alcide CleverMark  Lukens M.D.   On: 07/09/2019 07:17    Procedures Procedures (including critical care time)  Medications Ordered in ED Medications - No data to display   Initial Impression / Assessment and Plan / ED Course  I have reviewed the triage vital signs and the nursing notes.  Pertinent labs & imaging results that were available during my care of the patient were reviewed by me and considered in my medical decision making (see chart for details).  Vaginal bleeding. Stable hb since a few days ago. Will hold on transfusion as is not symptomatic. Will start OCP's, gyn follow up.  Final Clinical Impressions(s) / ED Diagnoses   Final diagnoses:  Vaginal bleeding  Anemia, unspecified type    ED Discharge Orders         Ordered    norgestimate-ethinyl estradiol (SPRINTEC 28) 0.25-35 MG-MCG tablet  Daily     07/09/19 0727    norgestimate-ethinyl estradiol (SPRINTEC 28) 0.25-35 MG-MCG tablet  Daily     07/09/19 0727           Sutter Ahlgren, Barbara CowerJason, MD 07/09/19 [phone]

## 2019-07-09 NOTE — Progress Notes (Signed)
Scheduling pool message sent. 

## 2019-07-09 NOTE — Telephone Encounter (Signed)
ER note reviewed

## 2019-07-09 NOTE — Discharge Instructions (Addendum)
You have received 2 prescriptions for a medication called Sprintec. This is an oral contraceptive. The first prescription is for one package. This one package he should take 3 pills a day for one week then throw the rest away. This should help with your uterine bleeding. It should cease during this time. After this week take one week off and during this time and will likely have a regular period. After the one week off without medication get the other prescription filled which is the same medication. Then start taking it once daily as instructed on the package. This may cause a little bit of nausea when you're taking 3 a day if so you may back down to twice a day. Please follow-up with her gynecologist in 1-2 weeks for further evaluation and management of your vaginal bleeding. °

## 2019-07-09 NOTE — Progress Notes (Signed)
appt's scheduled

## 2019-07-09 NOTE — Progress Notes (Signed)
Patient has not been scheduled as of 07/09/2019.  Scheduling pool message sent.

## 2019-07-10 LAB — GC/CHLAMYDIA PROBE AMP (~~LOC~~) NOT AT ARMC
Chlamydia: NEGATIVE
Neisseria Gonorrhea: NEGATIVE

## 2019-07-16 ENCOUNTER — Other Ambulatory Visit: Payer: Self-pay | Admitting: Oncology

## 2019-07-16 DIAGNOSIS — D509 Iron deficiency anemia, unspecified: Secondary | ICD-10-CM | POA: Insufficient documentation

## 2019-07-19 ENCOUNTER — Other Ambulatory Visit: Payer: Self-pay

## 2019-07-20 ENCOUNTER — Inpatient Hospital Stay: Payer: BC Managed Care – PPO | Attending: Oncology

## 2019-07-20 ENCOUNTER — Other Ambulatory Visit: Payer: Self-pay

## 2019-07-20 VITALS — BP 126/86 | HR 79 | Temp 97.3°F | Resp 18

## 2019-07-20 DIAGNOSIS — N920 Excessive and frequent menstruation with regular cycle: Secondary | ICD-10-CM | POA: Insufficient documentation

## 2019-07-20 DIAGNOSIS — D500 Iron deficiency anemia secondary to blood loss (chronic): Secondary | ICD-10-CM | POA: Diagnosis present

## 2019-07-20 DIAGNOSIS — D509 Iron deficiency anemia, unspecified: Secondary | ICD-10-CM

## 2019-07-20 MED ORDER — SODIUM CHLORIDE 0.9 % IV SOLN
Freq: Once | INTRAVENOUS | Status: AC
  Administered 2019-07-20: 14:00:00 via INTRAVENOUS
  Filled 2019-07-20: qty 250

## 2019-07-20 MED ORDER — IRON SUCROSE 20 MG/ML IV SOLN
200.0000 mg | Freq: Once | INTRAVENOUS | Status: AC
  Administered 2019-07-20: 200 mg via INTRAVENOUS
  Filled 2019-07-20: qty 10

## 2019-07-20 MED ORDER — SODIUM CHLORIDE 0.9 % IV SOLN: 200.0000 mg | Freq: Once | INTRAVENOUS | Status: DC

## 2019-07-20 NOTE — Progress Notes (Signed)
Pt tolerated infusion well. Pt and VS stable at discharge.  

## 2019-07-21 ENCOUNTER — Inpatient Hospital Stay: Payer: BC Managed Care – PPO

## 2019-07-22 ENCOUNTER — Inpatient Hospital Stay: Payer: BC Managed Care – PPO

## 2019-07-26 ENCOUNTER — Other Ambulatory Visit: Payer: Self-pay

## 2019-07-27 ENCOUNTER — Inpatient Hospital Stay: Payer: BC Managed Care – PPO

## 2019-07-28 ENCOUNTER — Inpatient Hospital Stay: Payer: BC Managed Care – PPO

## 2019-08-02 ENCOUNTER — Other Ambulatory Visit: Payer: Self-pay

## 2019-08-03 ENCOUNTER — Other Ambulatory Visit: Payer: BC Managed Care – PPO

## 2019-08-03 ENCOUNTER — Inpatient Hospital Stay: Payer: BC Managed Care – PPO

## 2019-08-03 ENCOUNTER — Other Ambulatory Visit: Payer: Self-pay

## 2019-08-03 VITALS — BP 150/90 | HR 88 | Temp 98.0°F | Resp 20

## 2019-08-03 DIAGNOSIS — D500 Iron deficiency anemia secondary to blood loss (chronic): Secondary | ICD-10-CM | POA: Diagnosis not present

## 2019-08-03 DIAGNOSIS — D509 Iron deficiency anemia, unspecified: Secondary | ICD-10-CM

## 2019-08-03 MED ORDER — IRON SUCROSE 20 MG/ML IV SOLN
200.0000 mg | Freq: Once | INTRAVENOUS | Status: AC
  Administered 2019-08-03: 15:00:00 200 mg via INTRAVENOUS
  Filled 2019-08-03: qty 10

## 2019-08-03 MED ORDER — SODIUM CHLORIDE 0.9 % IV SOLN
Freq: Once | INTRAVENOUS | Status: AC
  Administered 2019-08-03: 15:00:00 via INTRAVENOUS
  Filled 2019-08-03: qty 250

## 2019-08-03 MED ORDER — SODIUM CHLORIDE 0.9 % IV SOLN: 200.0000 mg | Freq: Once | INTRAVENOUS | Status: DC

## 2019-08-10 ENCOUNTER — Inpatient Hospital Stay: Payer: BC Managed Care – PPO

## 2019-08-10 ENCOUNTER — Inpatient Hospital Stay: Payer: BC Managed Care – PPO | Attending: Oncology

## 2019-08-10 DIAGNOSIS — N920 Excessive and frequent menstruation with regular cycle: Secondary | ICD-10-CM | POA: Insufficient documentation

## 2019-08-10 DIAGNOSIS — D500 Iron deficiency anemia secondary to blood loss (chronic): Secondary | ICD-10-CM | POA: Insufficient documentation

## 2019-08-17 ENCOUNTER — Other Ambulatory Visit: Payer: Self-pay

## 2019-08-17 ENCOUNTER — Encounter: Payer: Self-pay | Admitting: Oncology

## 2019-08-17 NOTE — Progress Notes (Signed)
Patient pre screened for office appointment, no questions or concerns today. 

## 2019-08-18 ENCOUNTER — Inpatient Hospital Stay: Payer: BC Managed Care – PPO

## 2019-08-18 ENCOUNTER — Other Ambulatory Visit: Payer: BC Managed Care – PPO

## 2019-08-18 ENCOUNTER — Inpatient Hospital Stay: Payer: BC Managed Care – PPO | Admitting: Oncology

## 2019-08-18 ENCOUNTER — Ambulatory Visit: Payer: BC Managed Care – PPO | Admitting: Family Medicine

## 2019-08-24 ENCOUNTER — Other Ambulatory Visit: Payer: Self-pay

## 2019-08-24 ENCOUNTER — Inpatient Hospital Stay: Payer: BC Managed Care – PPO

## 2019-08-24 VITALS — BP 159/95 | HR 78 | Resp 20

## 2019-08-24 DIAGNOSIS — D500 Iron deficiency anemia secondary to blood loss (chronic): Secondary | ICD-10-CM | POA: Diagnosis not present

## 2019-08-24 DIAGNOSIS — D509 Iron deficiency anemia, unspecified: Secondary | ICD-10-CM

## 2019-08-24 DIAGNOSIS — N920 Excessive and frequent menstruation with regular cycle: Secondary | ICD-10-CM | POA: Diagnosis not present

## 2019-08-24 MED ORDER — IRON SUCROSE 20 MG/ML IV SOLN
200.0000 mg | Freq: Once | INTRAVENOUS | Status: AC
  Administered 2019-08-24: 200 mg via INTRAVENOUS
  Filled 2019-08-24: qty 10

## 2019-08-24 MED ORDER — SODIUM CHLORIDE 0.9 % IV SOLN
Freq: Once | INTRAVENOUS | Status: AC
  Administered 2019-08-24: 15:00:00 via INTRAVENOUS
  Filled 2019-08-24: qty 250

## 2019-08-30 ENCOUNTER — Inpatient Hospital Stay: Payer: BC Managed Care – PPO

## 2019-08-30 ENCOUNTER — Other Ambulatory Visit: Payer: Self-pay

## 2019-08-30 DIAGNOSIS — D500 Iron deficiency anemia secondary to blood loss (chronic): Secondary | ICD-10-CM | POA: Diagnosis not present

## 2019-08-30 LAB — CBC WITH DIFFERENTIAL/PLATELET
Abs Immature Granulocytes: 0.03 10*3/uL (ref 0.00–0.07)
Basophils Absolute: 0.1 10*3/uL (ref 0.0–0.1)
Basophils Relative: 1 %
Eosinophils Absolute: 0.1 10*3/uL (ref 0.0–0.5)
Eosinophils Relative: 2 %
HCT: 27.3 % — ABNORMAL LOW (ref 36.0–46.0)
Hemoglobin: 7.6 g/dL — ABNORMAL LOW (ref 12.0–15.0)
Immature Granulocytes: 0 %
Lymphocytes Relative: 31 %
Lymphs Abs: 2.2 10*3/uL (ref 0.7–4.0)
MCH: 19.4 pg — ABNORMAL LOW (ref 26.0–34.0)
MCHC: 27.8 g/dL — ABNORMAL LOW (ref 30.0–36.0)
MCV: 69.6 fL — ABNORMAL LOW (ref 80.0–100.0)
Monocytes Absolute: 0.5 10*3/uL (ref 0.1–1.0)
Monocytes Relative: 8 %
Neutro Abs: 4.2 10*3/uL (ref 1.7–7.7)
Neutrophils Relative %: 58 %
Platelets: 338 10*3/uL (ref 150–400)
RBC: 3.92 MIL/uL (ref 3.87–5.11)
RDW: 25.3 % — ABNORMAL HIGH (ref 11.5–15.5)
WBC: 7.2 10*3/uL (ref 4.0–10.5)
nRBC: 0 % (ref 0.0–0.2)

## 2019-08-30 LAB — FERRITIN: Ferritin: 42 ng/mL (ref 11–307)

## 2019-08-30 LAB — IRON AND TIBC
Iron: 20 ug/dL — ABNORMAL LOW (ref 28–170)
Saturation Ratios: 5 % — ABNORMAL LOW (ref 10.4–31.8)
TIBC: 388 ug/dL (ref 250–450)
UIBC: 368 ug/dL

## 2019-08-30 NOTE — Progress Notes (Deleted)
Tried calling-no answer.  

## 2019-08-31 ENCOUNTER — Telehealth: Payer: Self-pay | Admitting: *Deleted

## 2019-08-31 ENCOUNTER — Inpatient Hospital Stay: Payer: BC Managed Care – PPO

## 2019-08-31 ENCOUNTER — Inpatient Hospital Stay: Payer: BC Managed Care – PPO | Admitting: Oncology

## 2019-08-31 NOTE — Telephone Encounter (Signed)
Called pt to see about getting her R/S for her 08/31/19 No Show appts A detailed message was left advising her to give the office a call back  to get her 08/31/19 appts R/S.

## 2019-12-08 IMAGING — US US PELVIS COMPLETE
1 series · 13 of 25 positions shown · non-contrast
Comparison: 02/03/2017

CLINICAL DATA: Vaginal bleeding



[Series 1: us pelvis complete · 13 of 94 slices shown]
[im 1/94]
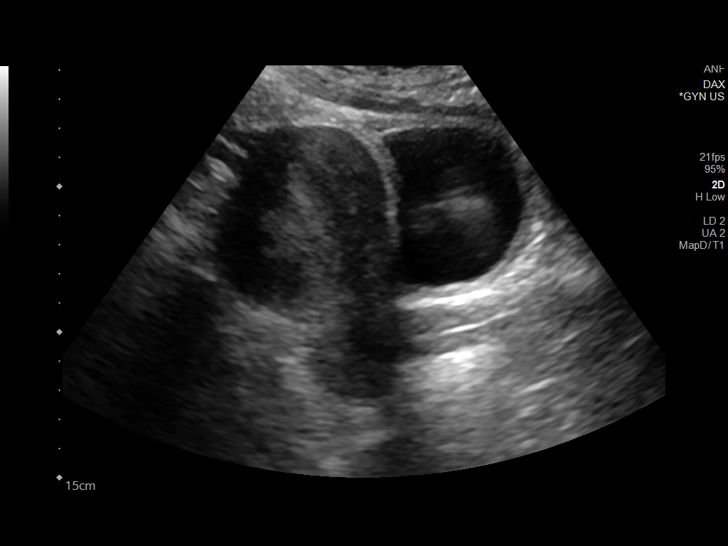
[im 8/94]
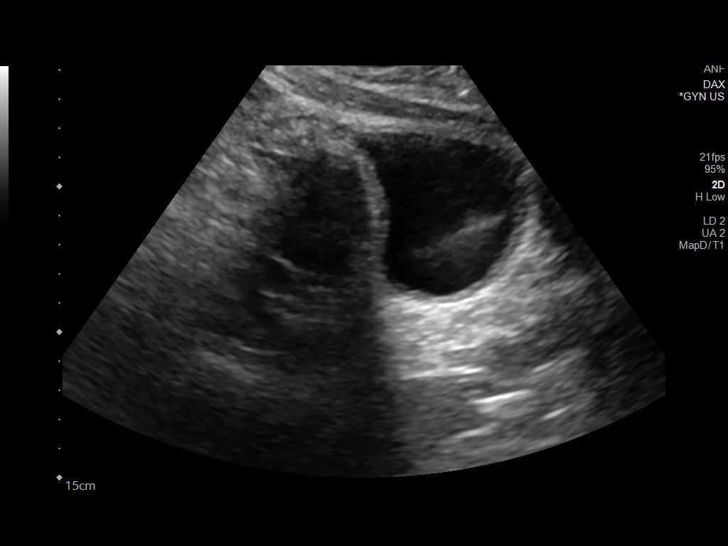
[im 16/94]
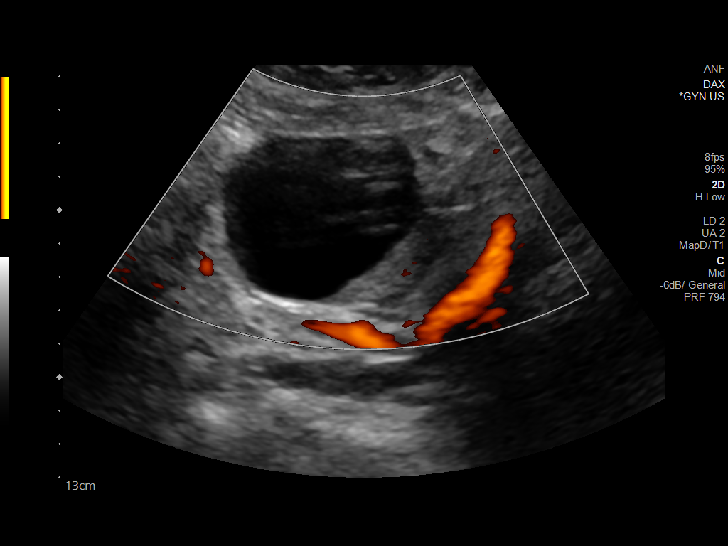
[im 24/94]
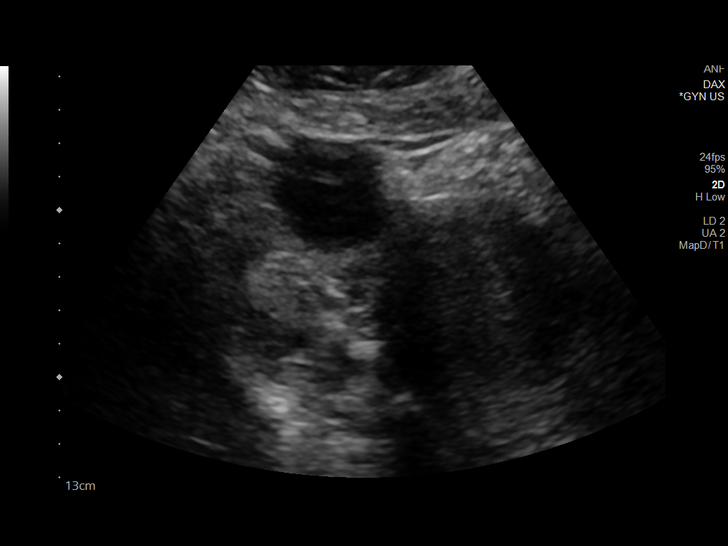
[im 32/94]
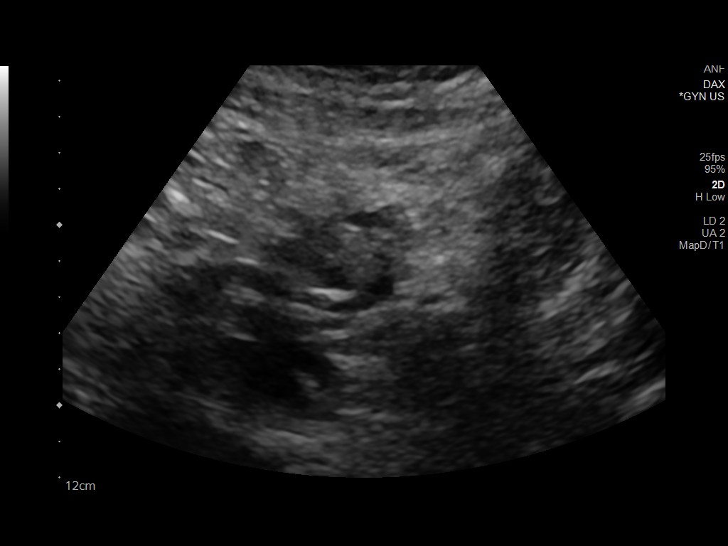
[im 39/94]
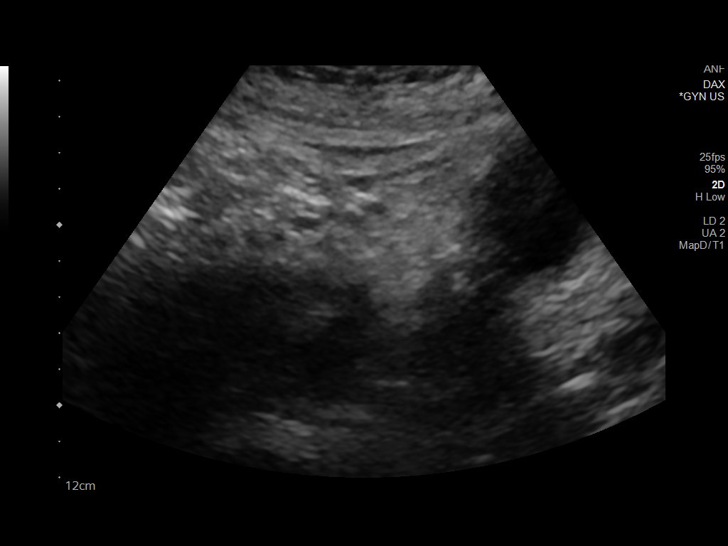
[im 47/94]
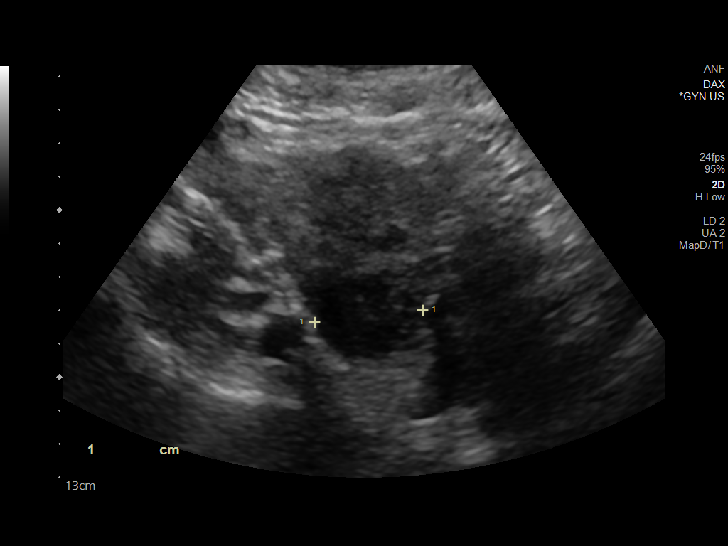
[im 55/94]
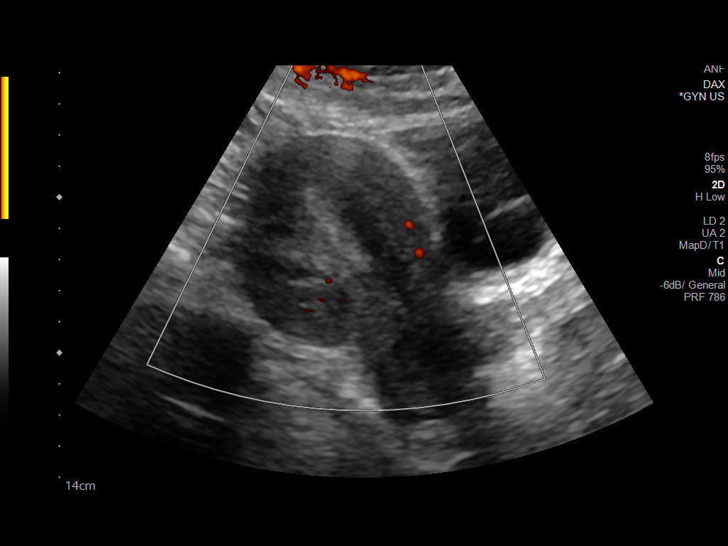
[im 63/94]
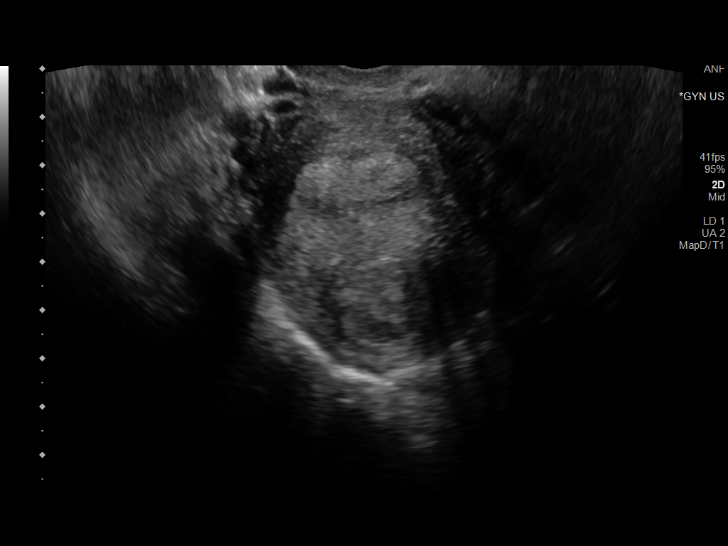
[im 70/94]
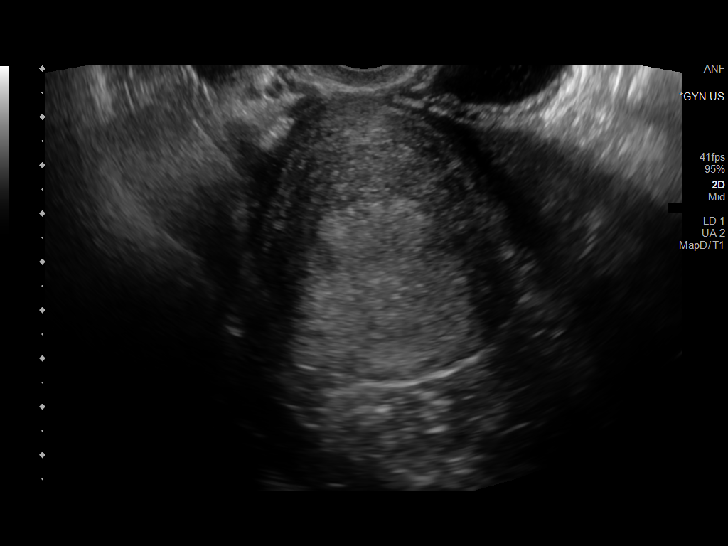
[im 78/94]
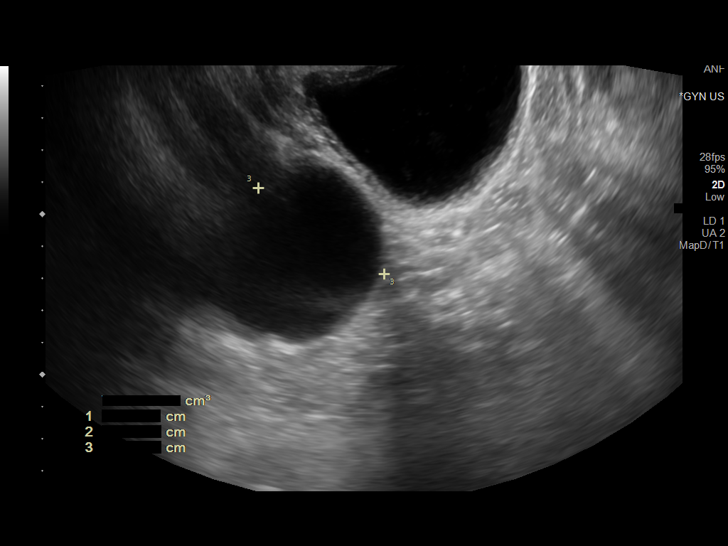
[im 86/94]
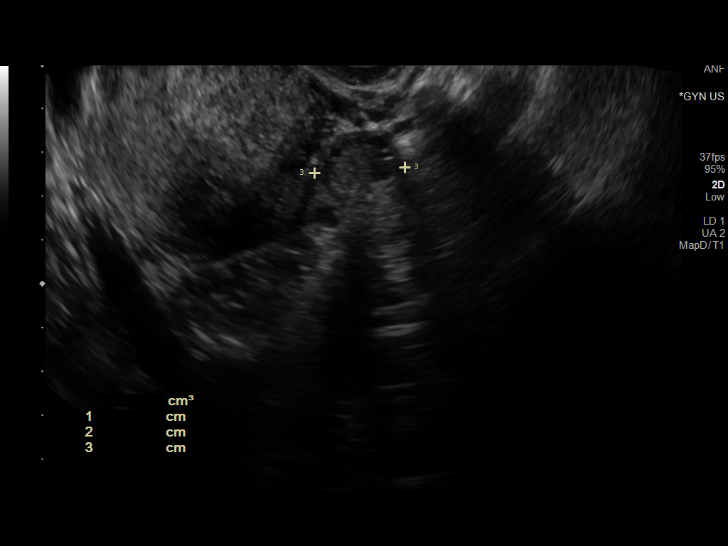
[im 94/94]
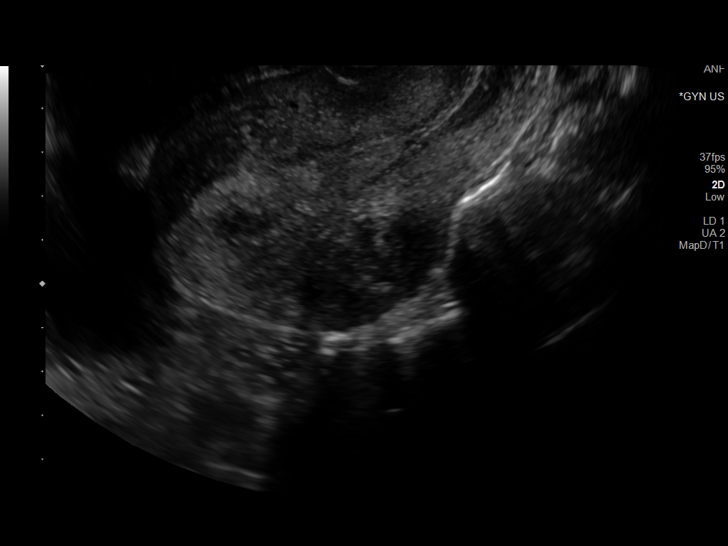

[13 of 25 positions shown; findings below may reference images not displayed]

FINDINGS: Uterus

Measurements: 10.2 x 6.7 x 5.5 cm. = volume: 194 mL. Single uterine
fibroid measuring 3.3 cm is noted posteriorly in the uterus.

Endometrium

Thickness: 15 mm.  No focal abnormality visualized.

Right ovary

Measurements: 7.8 x 6.1 x 6.5 cm. = volume: 162 mL. 6.3 cm cyst is
noted within the right ovary.

Left ovary

Measurements: 2.9 x 1.7 x 2.1 cm. = volume: 5.4 mL. Normal
appearance/no adnexal mass.

Other findings

No abnormal free fluid.
IMPRESSION: Uterine fibroid stable from the prior examination.

6.3 cm cyst within the right ovary. This has benign characteristics.
No imaging follow up is required for premenopausal females. This
follows consensus guidelines: Simple Adnexal Cysts: SRU Consensus
Conference Update on Follow-up and Reporting. Radiology [id number];

## 2019-12-28 ENCOUNTER — Other Ambulatory Visit: Payer: Self-pay | Admitting: Internal Medicine

## 2019-12-28 DIAGNOSIS — Z1231 Encounter for screening mammogram for malignant neoplasm of breast: Secondary | ICD-10-CM

## 2020-01-03 ENCOUNTER — Ambulatory Visit: Payer: BC Managed Care – PPO

## 2020-01-03 ENCOUNTER — Other Ambulatory Visit: Payer: Self-pay

## 2020-01-03 ENCOUNTER — Ambulatory Visit
Admission: RE | Admit: 2020-01-03 | Discharge: 2020-01-03 | Disposition: A | Discharge status: Home / Self Care | Payer: BC Managed Care – PPO | Source: Ambulatory Visit | Attending: Internal Medicine | Admitting: Internal Medicine

## 2020-01-03 DIAGNOSIS — Z1231 Encounter for screening mammogram for malignant neoplasm of breast: Secondary | ICD-10-CM

## 2020-01-04 ENCOUNTER — Ambulatory Visit: Payer: BC Managed Care – PPO | Admitting: Internal Medicine

## 2020-01-04 DIAGNOSIS — Z0289 Encounter for other administrative examinations: Secondary | ICD-10-CM

## 2020-01-04 NOTE — Progress Notes (Deleted)
Subjective:    Patient ID: Miranda [name], female    DOB: 02-12-1981, 39 y.o.   MRN: [id number]  HPI  Pt presents to the clinic today to discuss immunizations.  Review of Systems  Past Medical History:  Diagnosis Date  . Family history of breast cancer   . Obesity     No current outpatient medications on file.   No current facility-administered medications for this visit.    No Known Allergies  Family History  Adopted: Yes  Problem Relation Age of Onset  . Breast cancer Mother 66  . Healthy Sister   . Healthy Daughter     Social History   Socioeconomic History  . Marital status: Single    Spouse name: Not on file  . Number of children: Not on file  . Years of education: Not on file  . Highest education level: Not on file  Occupational History  . Not on file  Tobacco Use  . Smoking status: Never Smoker  . Smokeless tobacco: Never Used  Substance and Sexual Activity  . Alcohol use: No    Alcohol/week: 0.0 standard drinks  . Drug use: No  . Sexual activity: Never    Birth control/protection: None  Other Topics Concern  . Not on file  Social History Narrative  . Not on file   Social Determinants of Health   Financial Resource Strain:   . Difficulty of Paying Living Expenses: Not on file  Food Insecurity:   . Worried About Charity fundraiser in the Last Year: Not on file  . Ran Out of Food in the Last Year: Not on file  Transportation Needs:   . Lack of Transportation (Medical): Not on file  . Lack of Transportation (Non-Medical): Not on file  Physical Activity:   . Days of Exercise per Week: Not on file  . Minutes of Exercise per Session: Not on file  Stress:   . Feeling of Stress : Not on file  Social Connections:   . Frequency of Communication with Friends and Family: Not on file  . Frequency of Social Gatherings with Friends and Family: Not on file  . Attends Religious Services: Not on file  . Active Member of Clubs or Organizations:  Not on file  . Attends Archivist Meetings: Not on file  . Marital Status: Not on file  Intimate Partner Violence:   . Fear of Current or Ex-Partner: Not on file  . Emotionally Abused: Not on file  . Physically Abused: Not on file  . Sexually Abused: Not on file     Constitutional: Denies fever, malaise, fatigue, headache or abrupt weight changes.  HEENT: Denies eye pain, eye redness, ear pain, ringing in the ears, wax buildup, runny nose, nasal congestion, bloody nose, or sore throat. Respiratory: Denies difficulty breathing, shortness of breath, cough or sputum production.   Cardiovascular: Denies chest pain, chest tightness, palpitations or swelling in the hands or feet.  Gastrointestinal: Denies abdominal pain, bloating, constipation, diarrhea or blood in the stool.  GU: Denies urgency, frequency, pain with urination, burning sensation, blood in urine, odor or discharge. Musculoskeletal: Denies decrease in range of motion, difficulty with gait, muscle pain or joint pain and swelling.  Skin: Denies redness, rashes, lesions or ulcercations.  Neurological: Denies dizziness, difficulty with memory, difficulty with speech or problems with balance and coordination.  Psych: Denies anxiety, depression, SI/HI.  No other specific complaints in a complete review of systems (except as listed in HPI  above).      Objective:   Physical Exam   There were no vitals taken for this visit. Wt Readings from Last 3 Encounters:  07/08/19 (!) 303 lb (137.4 kg)  07/05/19 (!) 303 lb 8 oz (137.7 kg)  06/29/19 294 lb (133.4 kg)    General: Appears their stated age, well developed, well nourished in NAD. Skin: Warm, dry and intact. No rashes, lesions or ulcerations noted. HEENT: Head: normal shape and size; Eyes: sclera white, no icterus, conjunctiva pink, PERRLA and EOMs intact; Ears: Tm's gray and intact, normal light reflex; Nose: mucosa pink and moist, septum midline; Throat/Mouth: Teeth  present, mucosa pink and moist, no exudate, lesions or ulcerations noted.  Neck:  Neck supple, trachea midline. No masses, lumps or thyromegaly present.  Cardiovascular: Normal rate and rhythm. S1,S2 noted.  No murmur, rubs or gallops noted. No JVD or BLE edema. No carotid bruits noted. Pulmonary/Chest: Normal effort and positive vesicular breath sounds. No respiratory distress. No wheezes, rales or ronchi noted.  Abdomen: Soft and nontender. Normal bowel sounds. No distention or masses noted. Liver, spleen and kidneys non palpable. Musculoskeletal: Normal range of motion. No signs of joint swelling. No difficulty with gait.  Neurological: Alert and oriented. Cranial nerves II-XII grossly intact. Coordination normal.  Psychiatric: Mood and affect normal. Behavior is normal. Judgment and thought content normal.   EKG:  BMET    Component Value Date/Time   NA 137 06/15/2019 1200   K 3.9 06/15/2019 1200   CL 104 06/15/2019 1200   CO2 26 06/15/2019 1200   GLUCOSE 118 (H) 06/15/2019 1200   BUN 6 06/15/2019 1200   CREATININE 0.80 06/15/2019 1200   CALCIUM 9.2 06/15/2019 1200   GFRNONAA >60 11/08/2015 1502   GFRAA >60 11/08/2015 1502    Lipid Panel     Component Value Date/Time   CHOL 166 06/15/2019 1200   TRIG 62.0 06/15/2019 1200   HDL 55.50 06/15/2019 1200   CHOLHDL 3 06/15/2019 1200   VLDL 12.4 06/15/2019 1200   LDLCALC 98 06/15/2019 1200    CBC    Component Value Date/Time   WBC 7.2 08/30/2019 1434   RBC 3.92 08/30/2019 1434   HGB 7.6 (L) 08/30/2019 1434   HCT 27.3 (L) 08/30/2019 1434   PLT 338 08/30/2019 1434   MCV 69.6 (L) 08/30/2019 1434   MCH 19.4 (L) 08/30/2019 1434   MCHC 27.8 (L) 08/30/2019 1434   RDW 25.3 (H) 08/30/2019 1434   LYMPHSABS 2.2 08/30/2019 1434   MONOABS 0.5 08/30/2019 1434   EOSABS 0.1 08/30/2019 1434   BASOSABS 0.1 08/30/2019 1434    Hgb A1C Lab Results  Component Value Date   HGBA1C 6.4 06/15/2019           Assessment & Plan:

## 2020-02-28 ENCOUNTER — Encounter: Payer: Self-pay | Admitting: Internal Medicine

## 2020-02-28 ENCOUNTER — Telehealth (INDEPENDENT_AMBULATORY_CARE_PROVIDER_SITE_OTHER): Payer: BC Managed Care – PPO | Admitting: Internal Medicine

## 2020-02-28 ENCOUNTER — Other Ambulatory Visit: Payer: Self-pay

## 2020-02-28 DIAGNOSIS — F3281 Premenstrual dysphoric disorder: Secondary | ICD-10-CM

## 2020-02-28 NOTE — Patient Instructions (Signed)
Premenstrual Syndrome Premenstrual syndrome (PMS) is a group of physical, emotional, and behavioral symptoms that affect women of childbearing age as part of their menstrual cycle. PMS starts 1-2 weeks before the start of a woman's menstrual period and goes away a few days after menstrual bleeding starts. It often happens in a predictable pattern (recurs). PMS may cause other health conditions to become worse, such as asthma, allergies, and migraines. PMS can range from mild to severe. When it is severe, it is called premenstrual dysphoric disorder (PMDD). PMS may interfere with normal daily activities. What are the causes? The cause of this condition is not known, but it seems to be related to hormone changes that happen before menstruation. What are the signs or symptoms? Symptoms of this condition often happen every month. They go away completely after your period starts. Physical symptoms of this condition include:  Bloating.  Breast pain.  Headaches.  Extreme fatigue.  Backaches.  Swelling of the hands and feet.  Weight gain.  Hot flashes. Emotional and behavioral symptoms of this condition include:  Mood swings.  Depression.  Angry outbursts.  Irritability.  Anxiety.  Crying spells.  Food cravings or appetite changes.  Changes in sexual desire.  Confusion.  Aggression.  Social withdrawal.  Poor concentration. How is this diagnosed? This condition may be diagnosed based on a history of your symptoms. This condition is generally diagnosed if symptoms of PMS:  Are present in the 5 days before your period starts.  End within 4 days after your period starts.  Happen at least 3 months in a row.  Interfere with some of your normal activities. Other conditions that can cause some of these symptoms must be ruled out before PMS can be diagnosed. These include depression, anxiety, anemia, and thyroid problems. How is this treated? This condition may be treated  by:  Maintaining a healthy lifestyle. This includes eating a well-balanced diet and exercising regularly.  Taking medicines. Medicines can help relieve symptoms such as cramps, aches, pains, headaches, and breast tenderness. Depending on the severity of the condition, your health care provider may recommend various over-the-counter pain medicines. Follow these instructions at home: Eating and drinking   Eat a well-balanced diet.  Avoid caffeine and alcohol.  Limit the amount of salt and salty foods you eat. This will help reduce bloating.  Drink enough fluid to keep your urine pale yellow.  Take a multivitamin if told to do so by your health care provider. Lifestyle   Do not use any products that contain nicotine or tobacco, such as cigarettes, e-cigarettes, and chewing tobacco. If you need help quitting, ask your health care provider.  Exercise regularly as suggested by your health care provider.  Get enough sleep. For most adults, this is 7-8 hours of sleep each night.  Practice relaxation techniques such as yoga, tai chi, or meditation.  Find healthy ways to manage stress. General instructions   For 2-3 months, write down your symptoms, their severity, and how long they last. This will help your health care provider choose the best treatment for you.  Take over-the-counter and prescription medicines only as told by your health care provider.  If you are using birth control pills (oral contraceptives), use them as told by your health care provider. Contact a health care provider if:  Your symptoms get worse.  You develop new symptoms.  You have trouble doing your daily activities. Summary  Premenstrual syndrome (PMS) is a group of physical, emotional, and behavioral symptoms that   affect women of childbearing age.  PMS starts 1-2 weeks before the start of a woman's period and goes away a few days after the period starts.  PMS is treated by maintaining a healthy  lifestyle and taking medicines to relieve the symptoms. This information is not intended to replace advice given to you by your health care provider. Make sure you discuss any questions you have with your health care provider. Document Revised: 06/03/2018 Document Reviewed: 06/03/2018 Elsevier Patient Education  2020 [employer]  

## 2020-02-28 NOTE — Progress Notes (Signed)
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  Location: Patient: Work Provider: Office   I discussed the limitations of evaluation and management by telemedicine and the availability of in person appointments. The patient expressed understanding and agreed to proceed.  History of Present Illness:  Pt wanting to discuss her PMS issues. She has noticed over the last few years that 1-2 days prior to her period, she feels down and depressed. She reports normally she is able to go to work, but had a bad episode 02/03/20 and had to call out of work. She reports her employer will not pay her for that day even though she has sick time available. She reports she needs a work note but not exactly sure what it needs to say. She was prescribed Sertraline in 2019 for the same but she never took this medication.    Past Medical History:  Diagnosis Date  . Family history of breast cancer   . Obesity     No current outpatient medications on file.   No current facility-administered medications for this visit.    No Known Allergies  Family History  Adopted: Yes  Problem Relation Age of Onset  . Breast cancer Mother 36  . Healthy Sister   . Healthy Daughter     Social History   Socioeconomic History  . Marital status: Single    Spouse name: Not on file  . Number of children: Not on file  . Years of education: Not on file  . Highest education level: Not on file  Occupational History  . Not on file  Tobacco Use  . Smoking status: Never Smoker  . Smokeless tobacco: Never Used  Substance and Sexual Activity  . Alcohol use: No    Alcohol/week: 0.0 standard drinks  . Drug use: No  . Sexual activity: Never    Birth control/protection: None  Other Topics Concern  . Not on file  Social History Narrative  . Not on  file   Social Determinants of Health   Financial Resource Strain:   . Difficulty of Paying Living Expenses:   Food Insecurity:   . Worried About Charity fundraiser in the Last Year:   . Arboriculturist in the Last Year:   Transportation Needs:   . Film/video editor (Medical):   Marland Kitchen Lack of Transportation (Non-Medical):   Physical Activity:   . Days of Exercise per Week:   . Minutes of Exercise per Session:   Stress:   . Feeling of Stress :   Social Connections:   . Frequency of Communication with Friends and Family:   . Frequency of Social Gatherings with Friends and Family:   . Attends Religious Services:   . Active Member of Clubs or Organizations:   . Attends Archivist Meetings:   Marland Kitchen Marital Status:   Intimate Partner Violence:   . Fear of Current or Ex-Partner:   . Emotionally Abused:   Marland Kitchen Physically Abused:   . Sexually Abused:      Constitutional: Denies fever, malaise, fatigue, headache or abrupt weight changes.  Respiratory: Denies difficulty breathing, shortness of breath, cough or sputum production.   Cardiovascular: Denies chest pain, chest tightness, palpitations or swelling in the hands or feet.  Gastrointestinal: Denies abdominal pain, bloating, constipation, diarrhea or blood in  the stool.  GU: Denies urgency, frequency, pain with urination, burning sensation, blood in urine, odor or discharge. Neurological: Denies dizziness, difficulty with memory, difficulty with speech or problems with balance and coordination.  Psych: Pt reports depression around menses. Denies anxiety,  SI/HI.  No other specific complaints in a complete review of systems (except as listed in HPI above).  Observations/Objective: LMP 02/05/2020   Wt Readings from Last 3 Encounters:  07/08/19 (!) 303 lb (137.4 kg)  07/05/19 (!) 303 lb 8 oz (137.7 kg)  06/29/19 294 lb (133.4 kg)    General: Appears her stated age, obese, in NAD. Pulmonary/Chest: Normal effort. No  respiratory distress.  Neurological: Alert and oriented.  Psychiatric: Mood and affect normal. Behavior is normal. Judgment and thought content normal.     BMET    Component Value Date/Time   NA 137 06/15/2019 1200   K 3.9 06/15/2019 1200   CL 104 06/15/2019 1200   CO2 26 06/15/2019 1200   GLUCOSE 118 (H) 06/15/2019 1200   BUN 6 06/15/2019 1200   CREATININE 0.80 06/15/2019 1200   CALCIUM 9.2 06/15/2019 1200   GFRNONAA >60 11/08/2015 1502   GFRAA >60 11/08/2015 1502    Lipid Panel     Component Value Date/Time   CHOL 166 06/15/2019 1200   TRIG 62.0 06/15/2019 1200   HDL 55.50 06/15/2019 1200   CHOLHDL 3 06/15/2019 1200   VLDL 12.4 06/15/2019 1200   LDLCALC 98 06/15/2019 1200    CBC    Component Value Date/Time   WBC 7.2 08/30/2019 1434   RBC 3.92 08/30/2019 1434   HGB 7.6 (L) 08/30/2019 1434   HCT 27.3 (L) 08/30/2019 1434   PLT 338 08/30/2019 1434   MCV 69.6 (L) 08/30/2019 1434   MCH 19.4 (L) 08/30/2019 1434   MCHC 27.8 (L) 08/30/2019 1434   RDW 25.3 (H) 08/30/2019 1434   LYMPHSABS 2.2 08/30/2019 1434   MONOABS 0.5 08/30/2019 1434   EOSABS 0.1 08/30/2019 1434   BASOSABS 0.1 08/30/2019 1434    Hgb A1C Lab Results  Component Value Date   HGBA1C 6.4 06/15/2019        Assessment and Plan:  PMDD:  Flares intermittently Advised her I could not give her a work note for 4/1 Advised her to discuss further with administration Advised her to follow up with me if she needs anything further  Return precautions discussed  Follow Up Instructions:    I discussed the assessment and treatment plan with the patient. The patient was provided an opportunity to ask questions and all were answered. The patient agreed with the plan and demonstrated an understanding of the instructions.   The patient was advised to call back or seek an in-person evaluation if the symptoms worsen or if the condition fails to improve as anticipated.     Miranda Reaper, NP

## 2020-03-14 ENCOUNTER — Encounter: Payer: Self-pay | Admitting: Internal Medicine

## 2020-06-03 IMAGING — MG DIGITAL SCREENING BILAT W/ TOMO W/ CAD
8 of 12 series · 8 of 32 positions shown · non-contrast
Comparison: Previous exam(s).

ACR Breast Density Category a: The breast tissue is almost entirely
fatty.

CLINICAL DATA: Screening.

EXAM:
DIGITAL SCREENING BILATERAL MAMMOGRAM WITH TOMO AND CAD

[R MLO synth-2D (1 of 2)]
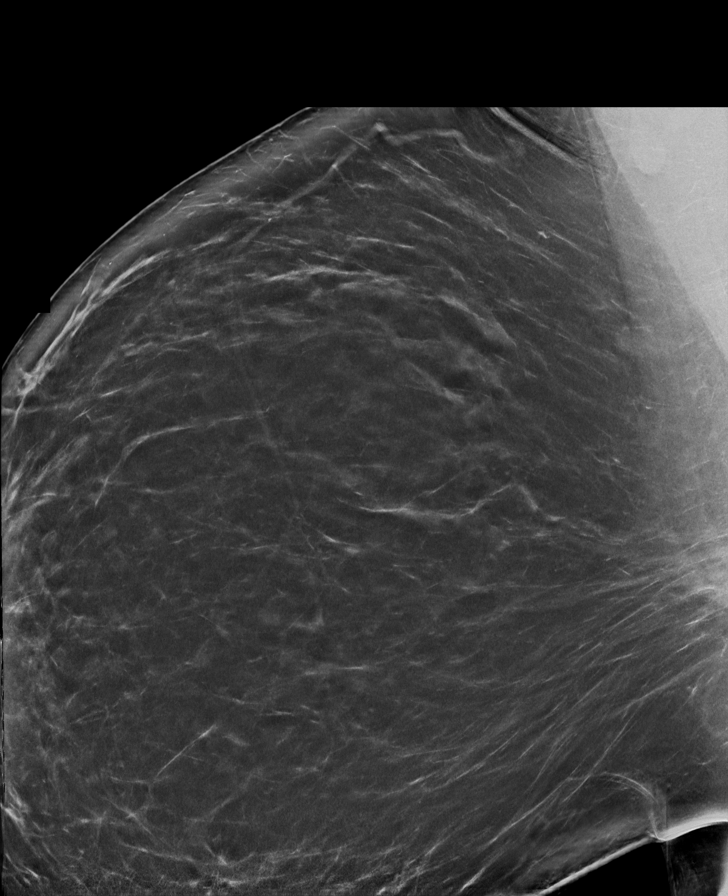

[R CC synth-2D]
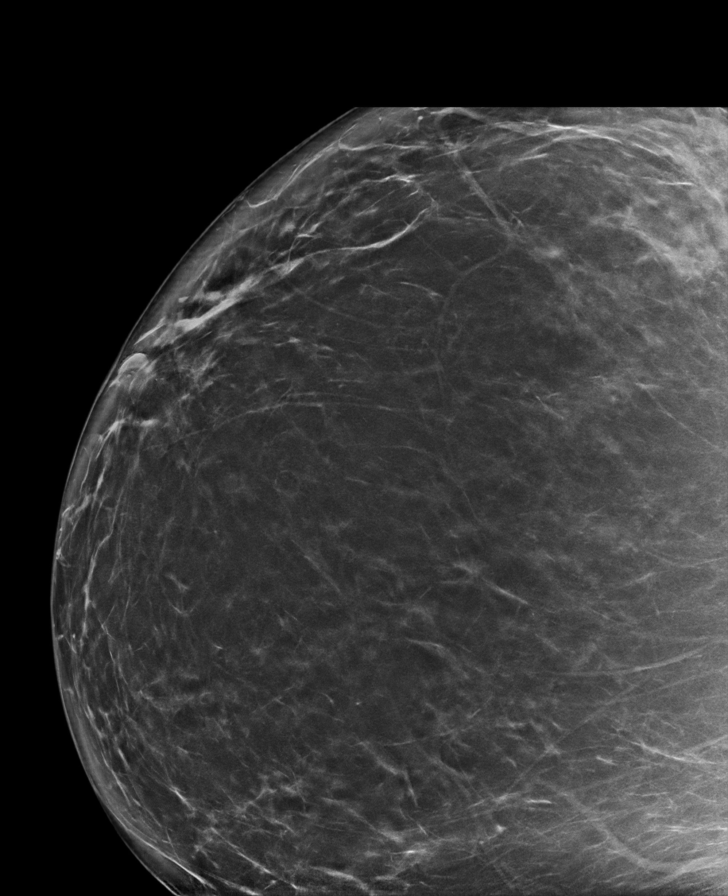

[L CV synth-2D]
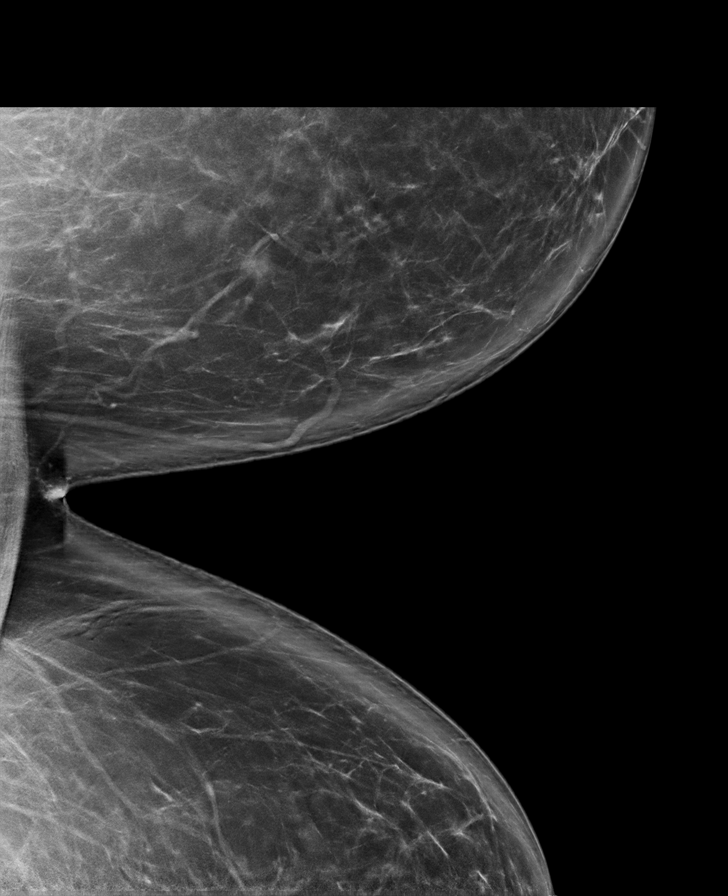

[L MLO synth-2D (1 of 2)]
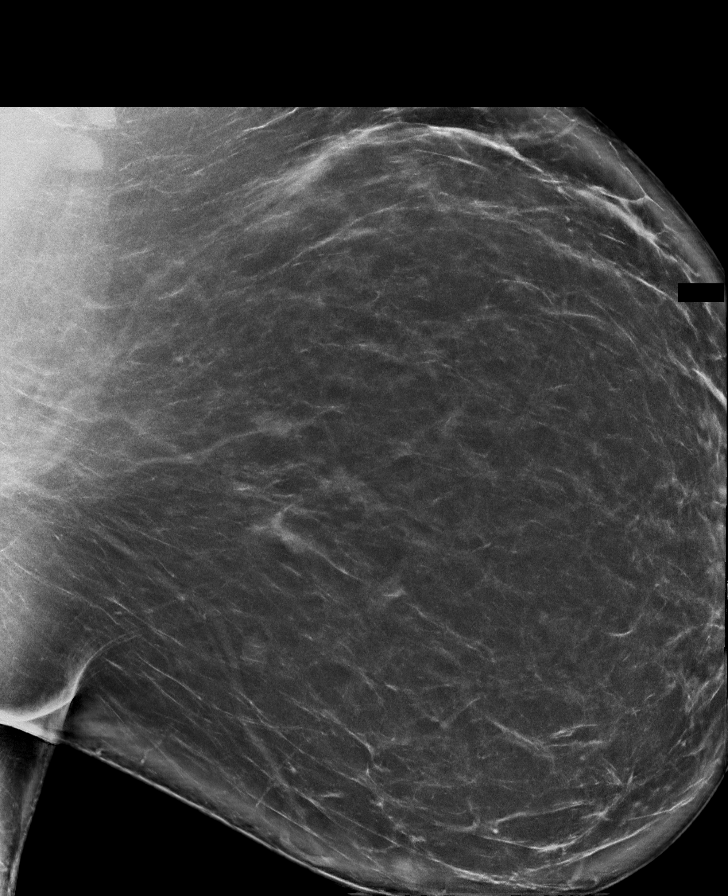

[L CC synth-2D]
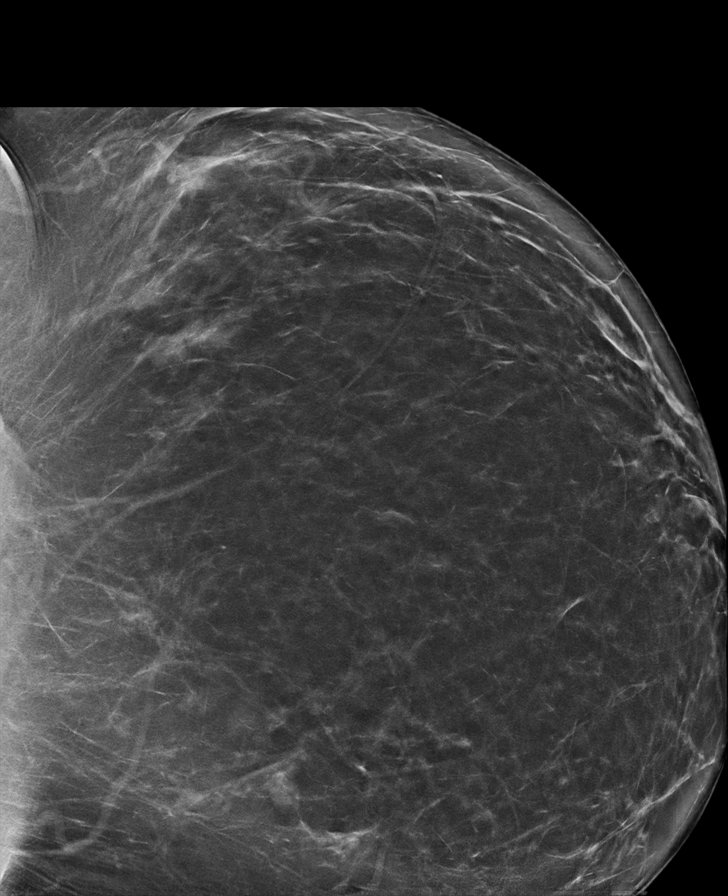

[R MLO synth-2D (2 of 2)]
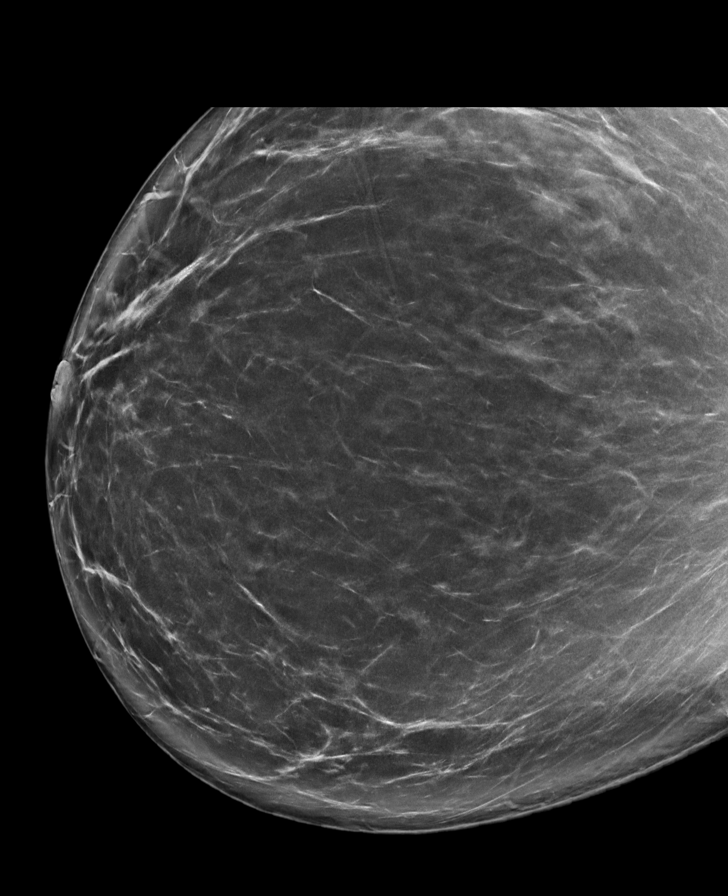

[L MLO synth-2D (2 of 2)]
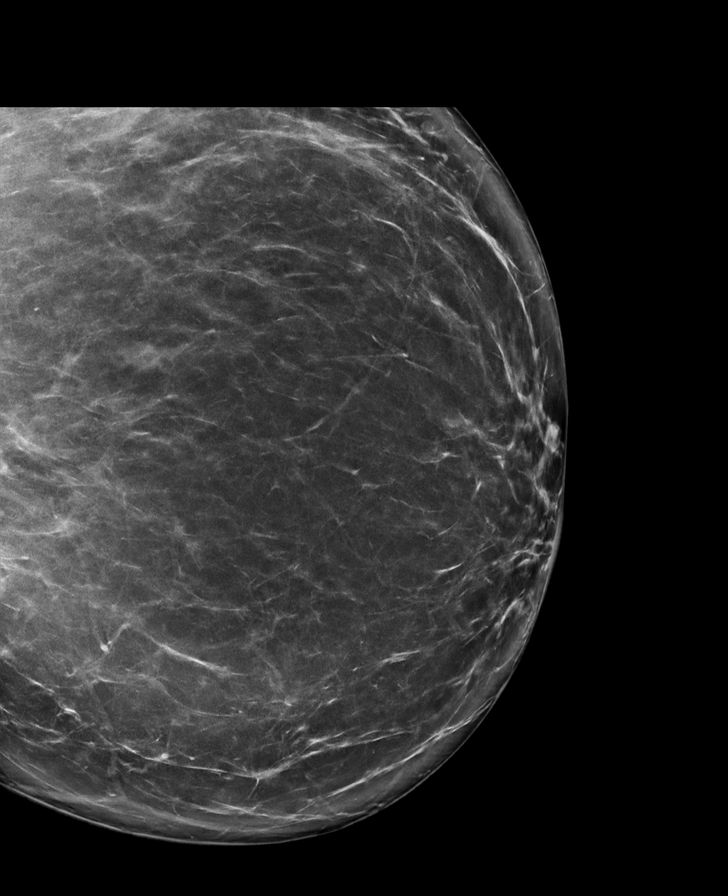

[L CV tomo · tomo slice 51/102.0]
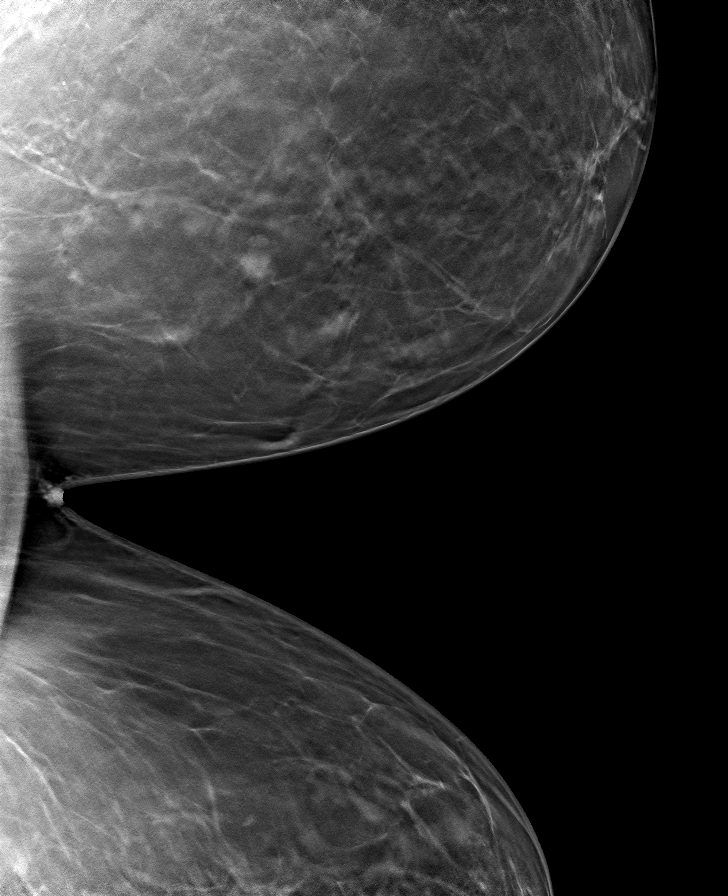

[8 of 32 positions shown; findings below may reference images not displayed]

FINDINGS: There are no findings suspicious for malignancy. Images were
processed with CAD.
IMPRESSION: No mammographic evidence of malignancy. A result letter of this
screening mammogram will be mailed directly to the patient.

RECOMMENDATION:
Screening mammogram at age 41. (Code:46-K-DUH)

BI-RADS CATEGORY  1: Negative.

## 2020-07-17 ENCOUNTER — Telehealth: Payer: Self-pay

## 2020-07-17 NOTE — Telephone Encounter (Signed)
[facility] Day - Client TELEPHONE ADVICE RECORD AccessNurse Patient Name: Indianna Lacerda Gender: Female DOB: 1981-01-19 Age: 39 Y 3 M 7 D Return Phone Number: [phone] (Primary) Address: City/State/Zip: [place] [address] Client [facility] Day - Client Client Site [facility] - Day Physician [name] - NP Contact Type Call Who Is Calling Patient / Member / Family / Caregiver Call Type Triage / Clinical Relationship To Patient Self Return Phone Number [phone] (Primary) Chief Complaint Heart palpitations or irregular heartbeat Reason for Call Symptomatic / Request for Health Information Initial Comment Caller states she noticed her heart rate was elevating yesterday. Her job is very stressful. It kept fluctuating even when sitting down. Translation No Nurse Assessment Nurse: Daphine Deutscher, RN, Cala Bradford Date/Time Lamount Cohen Time): 07/14/2020 4:17:15 PM Confirm and document reason for call. If symptomatic, describe symptoms. ---caller states yesterday she felt like her HR was elevating and states her job is very stressful. no fever. Has the patient had close contact with a person known or suspected to have the novel coronavirus illness OR traveled / lives in area with major community spread (including international travel) in the last 14 days from the onset of symptoms? * If Asymptomatic, screen for exposure and travel within the last 14 days. ---No Does the patient have any new or worsening symptoms? ---Yes Will a triage be completed? ---Yes Related visit to physician within the last 2 weeks? ---No Does the PT have any chronic conditions? (i.e. diabetes, asthma, this includes High risk factors for pregnancy, etc.) ---No Is the patient pregnant or possibly pregnant? (Ask all females between the ages of 28-55) ---No Is this a behavioral health or substance abuse call? ---No Guidelines Guideline Title Affirmed  Question Affirmed Notes Nurse Date/Time Lamount Cohen Time) Heart Rate and Heartbeat Questions Palpitations Abram Sander 07/14/2020 4:19:35 PM Disp. Time Lamount Cohen Time) Disposition Final User 07/14/2020 4:27:59 PM Home Care Yes Daphine Deutscher, RN, KimberlyPLEASE NOTE: All timestamps contained within this report are represented as [place] Standard Time. CONFIDENTIALTY NOTICE: This fax transmission is intended only for the addressee. It contains information that is legally privileged, confidential or otherwise protected from use or disclosure. If you are not the intended recipient, you are strictly prohibited from reviewing, disclosing, copying using or disseminating any of this information or taking any action in reliance on or regarding this information. If you have received this fax in error, please notify us immediately by telephone so that we can arrange for its return to [place]. Phone: [phone], Toll-Free: [phone], Fax: [phone] Page: 2 of 2 Call Id: [id number] Caller Disagree/Comply Comply Caller Understands Yes PreDisposition Call Doctor Care Advice Given Per Guideline HOME CARE: * You should be able to treat this at home. * Here is some care advice that should help. * Everybody experiences palpitations at some point in their lives. In many circumstances it is simply a heightened awareness of the heart's normal beating. REASSURANCE AND EDUCATION - PALPITATIONS: * People with anxiety or stress may describe a 'rapid heartbeat' or 'pounding' in their chest from their heart beating. HEALTH BASICS: * Sleep: Try to get a sufficient amount of sleep. Lack of sleep can aggravate palpitations. Most people need 7 to 8 hours of sleep each night. * Exercise: Regular exercise will improve your overall health, improve your mood, and is a simple method to reduce stress. * Diet: Eat a balanced healthy diet. * Liquid Intake: Drink adequate liquids, 6 to 8 glasses of water daily. *  Avoid caffeine-containing  beverages (Reason: caffeine is a stimulant and can aggravate palpitations). AVOID CAFFEINE: * Examples include coffee, tea, colas, [address] [employer], and some 'energy drinks'. LIMIT ALCOHOL: * Limit your alcohol consumption to no more than 2 drinks a day. * Ideally, eliminate alcohol entirely for the next two weeks. OTHER: * For smokers - Stop or reduce your smoking * Avoid diet pills (Reason: they act as stimulants.) CALL BACK IF: * Chest pain, lightheadedness or difficulty breathing occurs * Heart beating over 140 beats / minute * More than 3 extra or skipped beats / minute * You become worse CARE ADVICE given per Heart Rate and Heartbeat Questions (Adult) guideline.

## 2020-07-17 NOTE — Telephone Encounter (Signed)
Was an appt made?

## 2020-07-18 NOTE — Telephone Encounter (Signed)
lmovm seeing if pt can come in 9/15 or 9/16 at 4pm for OV to be evaluated per [facility]

## 2020-07-20 ENCOUNTER — Ambulatory Visit: Payer: BC Managed Care – PPO | Admitting: Internal Medicine

## 2020-07-20 ENCOUNTER — Encounter: Payer: Self-pay | Admitting: Internal Medicine

## 2020-07-20 ENCOUNTER — Other Ambulatory Visit: Payer: Self-pay

## 2020-07-20 VITALS — BP 156/98 | HR 83 | Temp 97.9°F | Wt 313.0 lb

## 2020-07-20 DIAGNOSIS — R002 Palpitations: Secondary | ICD-10-CM

## 2020-07-20 DIAGNOSIS — Z566 Other physical and mental strain related to work: Secondary | ICD-10-CM | POA: Diagnosis not present

## 2020-07-20 DIAGNOSIS — I517 Cardiomegaly: Secondary | ICD-10-CM | POA: Diagnosis not present

## 2020-07-20 DIAGNOSIS — I10 Essential (primary) hypertension: Secondary | ICD-10-CM

## 2020-07-20 MED ORDER — LOSARTAN POTASSIUM-HCTZ 50-12.5 MG PO TABS: 0.5000 | ORAL_TABLET | Freq: Every day | ORAL | 0 refills | Status: DC

## 2020-07-20 NOTE — Progress Notes (Signed)
Subjective:    Patient ID: Miranda [name], female    DOB: [birth year], 39 y.o.   MRN: [id number]  HPI  Pt presents to the clinic today with c/o palpitations. She reports that she started a new job this month and that is when the problem began. She experencies the palpitations only while at work and when reading emails or dealing with stress. On the weekends, she walks and works in the garden and the symptoms are not a problem. She denies associated chest pain, chest tightness, shortness of breath, dizziness or syncope. She has tried Aspirin without relief.Denies   Of note, her BP today is 156/98. Her BP has been running 150/90 for the past year but she has not been diagnosed with HTN in the past. She has gained some weight recently.  Review of Systems      Past Medical History:  Diagnosis Date  . Family history of breast cancer   . Obesity     No current outpatient medications on file.   No current facility-administered medications for this visit.    No Known Allergies  Family History  Adopted: Yes  Problem Relation Age of Onset  . Breast cancer Mother 36  . Healthy Sister   . Healthy Daughter     Social History   Socioeconomic History  . Marital status: Single    Spouse name: Not on file  . Number of children: Not on file  . Years of education: Not on file  . Highest education level: Not on file  Occupational History  . Not on file  Tobacco Use  . Smoking status: Never Smoker  . Smokeless tobacco: Never Used  Substance and Sexual Activity  . Alcohol use: No    Alcohol/week: 0.0 standard drinks  . Drug use: No  . Sexual activity: Never    Birth control/protection: None  Other Topics Concern  . Not on file  Social History Narrative  . Not on file   Social Determinants of Health   Financial Resource Strain:   . Difficulty of Paying Living Expenses: Not on file  Food Insecurity:   . Worried About Programme researcher, broadcasting/film/video in the Last Year: Not on file  .  Ran Out of Food in the Last Year: Not on file  Transportation Needs:   . Lack of Transportation (Medical): Not on file  . Lack of Transportation (Non-Medical): Not on file  Physical Activity:   . Days of Exercise per Week: Not on file  . Minutes of Exercise per Session: Not on file  Stress:   . Feeling of Stress : Not on file  Social Connections:   . Frequency of Communication with Friends and Family: Not on file  . Frequency of Social Gatherings with Friends and Family: Not on file  . Attends Religious Services: Not on file  . Active Member of Clubs or Organizations: Not on file  . Attends Banker Meetings: Not on file  . Marital Status: Not on file  Intimate Partner Violence:   . Fear of Current or Ex-Partner: Not on file  . Emotionally Abused: Not on file  . Physically Abused: Not on file  . Sexually Abused: Not on file     Constitutional: Denies fever, malaise, fatigue, headache or abrupt weight changes.  Respiratory: Denies difficulty breathing, shortness of breath, cough or sputum production.   Cardiovascular: Pt reports palpitations. Denies chest pain, chest tightness, or swelling in the hands or feet.  Neurological: Denies dizziness,  difficulty with memory, difficulty with speech or problems with balance and coordination.  Psych: Pt reports stress at work. Denies anxiety, depression, SI/HI.  No other specific complaints in a complete review of systems (except as listed in HPI above).  Objective:   Physical Exam  BP (!) 156/98   Pulse 83   Temp 97.9 F (36.6 C) (Temporal)   Wt (!) 313 lb (142 kg)   SpO2 98%   BMI 61.13 kg/m   Wt Readings from Last 3 Encounters:  07/08/19 (!) 303 lb (137.4 kg)  07/05/19 (!) 303 lb 8 oz (137.7 kg)  06/29/19 294 lb (133.4 kg)    General: Appears her stated age, obese in NAD. Neck:  Neck supple, trachea midline. No masses, lumps or thyromegaly present.  Cardiovascular: Normal rate and rhythm. S1,S2 noted.  No  murmur, rubs or gallops noted.  Pulmonary/Chest: Normal effort and positive vesicular breath sounds. No respiratory distress. No wheezes, rales or ronchi noted.  Neurological: Alert and oriented. Coordination normal.  Psychiatric: Mildly anxious appearing. Behavior is normal. Judgment and thought content nrmal.    BMET    Component Value Date/Time   NA 137 06/15/2019 1200   K 3.9 06/15/2019 1200   CL 104 06/15/2019 1200   CO2 26 06/15/2019 1200   GLUCOSE 118 (H) 06/15/2019 1200   BUN 6 06/15/2019 1200   CREATININE 0.80 06/15/2019 1200   CALCIUM 9.2 06/15/2019 1200   GFRNONAA >60 11/08/2015 1502   GFRAA >60 11/08/2015 1502    Lipid Panel     Component Value Date/Time   CHOL 166 06/15/2019 1200   TRIG 62.0 06/15/2019 1200   HDL 55.50 06/15/2019 1200   CHOLHDL 3 06/15/2019 1200   VLDL 12.4 06/15/2019 1200   LDLCALC 98 06/15/2019 1200    CBC    Component Value Date/Time   WBC 7.2 08/30/2019 1434   RBC 3.92 08/30/2019 1434   HGB 7.6 (L) 08/30/2019 1434   HCT 27.3 (L) 08/30/2019 1434   PLT 338 08/30/2019 1434   MCV 69.6 (L) 08/30/2019 1434   MCH 19.4 (L) 08/30/2019 1434   MCHC 27.8 (L) 08/30/2019 1434   RDW 25.3 (H) 08/30/2019 1434   LYMPHSABS 2.2 08/30/2019 1434   MONOABS 0.5 08/30/2019 1434   EOSABS 0.1 08/30/2019 1434   BASOSABS 0.1 08/30/2019 1434    Hgb A1C Lab Results  Component Value Date   HGBA1C 6.4 06/15/2019           Assessment & Plan:  Palpitations, Stress at Work, HTN:  Indication for ECG: Palpitations Interpretation of ECG: Normal rate, rhythm, right ventricular hypertrophy Comparison of ECG: 11/2015, unchanged Will check BMET and TSH RX for Losartan HCT 50-15.5 mg 1/2 tab daily Reinforced DASH diet and exercise for weight loss  RTC in 2 week for followup HTN  [name], NP This visit occurred during the SARS-CoV-2 public health emergency.  Safety protocols were in place, including screening questions prior to the visit, additional  usage of staff PPE, and extensive cleaning of exam room while observing appropriate contact time as indicated for disinfecting solutions.

## 2020-07-21 ENCOUNTER — Encounter: Payer: Self-pay | Admitting: Internal Medicine

## 2020-07-21 LAB — BASIC METABOLIC PANEL
BUN: 12 mg/dL (ref 6–23)
CO2: 26 mEq/L (ref 19–32)
Calcium: 9.1 mg/dL (ref 8.4–10.5)
Chloride: 104 mEq/L (ref 96–112)
Creatinine, Ser: 0.76 mg/dL (ref 0.40–1.20)
GFR: 102.9 mL/min (ref >60.00)
Glucose, Bld: 99 mg/dL (ref 70–99)
Potassium: 3.5 mEq/L (ref 3.5–5.1)
Sodium: 139 mEq/L (ref 135–145)

## 2020-07-21 LAB — TSH: TSH: 1.89 u[IU]/mL (ref 0.35–4.50)

## 2020-07-21 NOTE — Patient Instructions (Signed)

## 2020-08-03 ENCOUNTER — Ambulatory Visit (INDEPENDENT_AMBULATORY_CARE_PROVIDER_SITE_OTHER): Payer: BC Managed Care – PPO | Admitting: Internal Medicine

## 2020-08-03 ENCOUNTER — Encounter: Payer: Self-pay | Admitting: Internal Medicine

## 2020-08-03 ENCOUNTER — Other Ambulatory Visit: Payer: Self-pay

## 2020-08-03 VITALS — BP 142/100 | HR 82

## 2020-08-03 DIAGNOSIS — I10 Essential (primary) hypertension: Secondary | ICD-10-CM | POA: Diagnosis not present

## 2020-08-03 NOTE — Progress Notes (Signed)
Subjective:    Patient ID: Miranda [name], female    DOB: 1980-12-16, 39 y.o.   MRN: [id number]  HPI  Patient presents the clinic today for 2-week follow-up of hypertension.  At her last visit she was started on Losartan-HCT.  She has been taking the medication as prescribed.  Her BP today is 142/100.  ECG from 07/2020 reviewed.  Review of Systems  Past Medical History:  Diagnosis Date  . Family history of breast cancer   . Obesity     Current Outpatient Medications  Medication Sig Dispense Refill  . losartan-hydrochlorothiazide (HYZAAR) 50-12.5 MG tablet Take 0.5 tablets by mouth daily. 30 tablet 0   No current facility-administered medications for this visit.    No Known Allergies  Family History  Adopted: Yes  Problem Relation Age of Onset  . Breast cancer Mother 59  . Healthy Sister   . Healthy Daughter     Social History   Socioeconomic History  . Marital status: Single    Spouse name: Not on file  . Number of children: Not on file  . Years of education: Not on file  . Highest education level: Not on file  Occupational History  . Not on file  Tobacco Use  . Smoking status: Never Smoker  . Smokeless tobacco: Never Used  Substance and Sexual Activity  . Alcohol use: No    Alcohol/week: 0.0 standard drinks  . Drug use: No  . Sexual activity: Never    Birth control/protection: None  Other Topics Concern  . Not on file  Social History Narrative  . Not on file   Social Determinants of Health   Financial Resource Strain:   . Difficulty of Paying Living Expenses: Not on file  Food Insecurity:   . Worried About Programme researcher, broadcasting/film/video in the Last Year: Not on file  . Ran Out of Food in the Last Year: Not on file  Transportation Needs:   . Lack of Transportation (Medical): Not on file  . Lack of Transportation (Non-Medical): Not on file  Physical Activity:   . Days of Exercise per Week: Not on file  . Minutes of Exercise per Session: Not on file   Stress:   . Feeling of Stress : Not on file  Social Connections:   . Frequency of Communication with Friends and Family: Not on file  . Frequency of Social Gatherings with Friends and Family: Not on file  . Attends Religious Services: Not on file  . Active Member of Clubs or Organizations: Not on file  . Attends Banker Meetings: Not on file  . Marital Status: Not on file  Intimate Partner Violence:   . Fear of Current or Ex-Partner: Not on file  . Emotionally Abused: Not on file  . Physically Abused: Not on file  . Sexually Abused: Not on file     Constitutional: Denies fever, malaise, fatigue, headache or abrupt weight changes.  Respiratory: Denies difficulty breathing, shortness of breath, cough or sputum production.   Cardiovascular: Denies chest pain, chest tightness, palpitations or swelling in the hands or feet.   No other specific complaints in a complete review of systems (except as listed in HPI above).     Objective:   Physical Exam  BP (!) 142/100   Pulse 82   Wt Readings from Last 3 Encounters:  07/20/20 (!) 313 lb (142 kg)  07/08/19 (!) 303 lb (137.4 kg)  07/05/19 (!) 303 lb 8 oz (137.7 kg)  General: Appears her stated age, obese, in NAD. Cardiovascular: Normal rate and rhythm. S1,S2 noted.  No murmur, rubs or gallops noted. Trace BLE edema. Pulmonary/Chest: Normal effort and positive vesicular breath sounds. No respiratory distress. No wheezes, rales or ronchi noted.  Neurological: Alert and oriented.    BMET    Component Value Date/Time   NA 139 07/20/2020 1700   K 3.5 07/20/2020 1700   CL 104 07/20/2020 1700   CO2 26 07/20/2020 1700   GLUCOSE 99 07/20/2020 1700   BUN 12 07/20/2020 1700   CREATININE 0.76 07/20/2020 1700   CALCIUM 9.1 07/20/2020 1700   GFRNONAA >60 11/08/2015 1502   GFRAA >60 11/08/2015 1502    Lipid Panel     Component Value Date/Time   CHOL 166 06/15/2019 1200   TRIG 62.0 06/15/2019 1200   HDL 55.50  06/15/2019 1200   CHOLHDL 3 06/15/2019 1200   VLDL 12.4 06/15/2019 1200   LDLCALC 98 06/15/2019 1200    CBC    Component Value Date/Time   WBC 7.2 08/30/2019 1434   RBC 3.92 08/30/2019 1434   HGB 7.6 (L) 08/30/2019 1434   HCT 27.3 (L) 08/30/2019 1434   PLT 338 08/30/2019 1434   MCV 69.6 (L) 08/30/2019 1434   MCH 19.4 (L) 08/30/2019 1434   MCHC 27.8 (L) 08/30/2019 1434   RDW 25.3 (H) 08/30/2019 1434   LYMPHSABS 2.2 08/30/2019 1434   MONOABS 0.5 08/30/2019 1434   EOSABS 0.1 08/30/2019 1434   BASOSABS 0.1 08/30/2019 1434    Hgb A1C Lab Results  Component Value Date   HGBA1C 6.4 06/15/2019          Assessment & Plan:    [name], NP This visit occurred during the SARS-CoV-2 public health emergency.  Safety protocols were in place, including screening questions prior to the visit, additional usage of staff PPE, and extensive cleaning of exam room while observing appropriate contact time as indicated for disinfecting solutions.

## 2020-08-03 NOTE — Assessment & Plan Note (Signed)
Improved but not at goal Increase Losartan to 1 tab (50-12.5 mg)  RTC in 2 weeks for nurse visit BP check and be met in the lab 

## 2020-08-03 NOTE — Patient Instructions (Signed)

## 2020-08-04 ENCOUNTER — Other Ambulatory Visit: Payer: Self-pay | Admitting: Internal Medicine

## 2020-08-04 DIAGNOSIS — I10 Essential (primary) hypertension: Secondary | ICD-10-CM

## 2020-08-17 ENCOUNTER — Other Ambulatory Visit: Payer: BC Managed Care – PPO

## 2020-08-17 ENCOUNTER — Other Ambulatory Visit: Payer: Self-pay

## 2020-08-17 ENCOUNTER — Ambulatory Visit: Payer: BC Managed Care – PPO

## 2020-10-18 ENCOUNTER — Other Ambulatory Visit: Payer: Self-pay | Admitting: Internal Medicine

## 2020-10-18 ENCOUNTER — Telehealth (INDEPENDENT_AMBULATORY_CARE_PROVIDER_SITE_OTHER): Payer: BC Managed Care – PPO | Admitting: Internal Medicine

## 2020-10-18 ENCOUNTER — Encounter: Payer: Self-pay | Admitting: Internal Medicine

## 2020-10-18 DIAGNOSIS — J3089 Other allergic rhinitis: Secondary | ICD-10-CM

## 2020-10-18 NOTE — Progress Notes (Signed)
Virtual Visit via Video Note  I connected with Miranda [name] on 10/18/20 at  2:00 PM EST by a video enabled telemedicine application and verified that I am speaking with the correct person using two identifiers.  Location: Patient: Home Provider: Office  Person's participating in this video call: [name], NP-C and Buyer, retail.   I discussed the limitations of evaluation and management by telemedicine and the availability of in person appointments. The patient expressed understanding and agreed to proceed.  History of Present Illness:  Pt reports fatigue, nasal congestion and ear fullness. This started 5 days ago. She is not blowing anything out of her nose. She denies ear pain or decreased hearing. She had a sore throat that has since gotten better. She denies headache, dizziness, visual changes, runny nose, loss of taste, loss of smell, cough or SOB. She denies fever, chills or body aches. She has tried Nyquil OTC with minimal relief. She has not had exposure to Covid that she is aware of. She did not get tested for covid.   Past Medical History:  Diagnosis Date  . Family history of breast cancer   . Obesity     Current Outpatient Medications  Medication Sig Dispense Refill  . losartan-hydrochlorothiazide (HYZAAR) 50-12.5 MG tablet Take 1 tablet by mouth daily. 30 tablet 0   No current facility-administered medications for this visit.    No Known Allergies  Family History  Adopted: Yes  Problem Relation Age of Onset  . Breast cancer Mother 45  . Healthy Sister   . Healthy Daughter     Social History   Socioeconomic History  . Marital status: Single    Spouse name: Not on file  . Number of children: Not on file  . Years of education: Not on file  . Highest education level: Not on file  Occupational History  . Not on file  Tobacco Use  . Smoking status: Never Smoker  . Smokeless tobacco: Never Used  Substance and Sexual Activity  . Alcohol use: No     Alcohol/week: 0.0 standard drinks  . Drug use: No  . Sexual activity: Never    Birth control/protection: None  Other Topics Concern  . Not on file  Social History Narrative  . Not on file   Social Determinants of Health   Financial Resource Strain: Not on file  Food Insecurity: Not on file  Transportation Needs: Not on file  Physical Activity: Not on file  Stress: Not on file  Social Connections: Not on file  Intimate Partner Violence: Not on file     Constitutional: Pt reports fatigue. Denies fever, malaise, headache or abrupt weight changes.  HEENT: Pt report nasal congestion, ear pain. Denies eye pain, eye redness, ringing in the ears, wax buildup, runny nose, bloody nose, or sore throat. Respiratory: Denies difficulty breathing, shortness of breath, cough or sputum production.   Cardiovascular: Denies chest pain, chest tightness, palpitations or swelling in the hands or feet.  Gastrointestinal: Denies abdominal pain, bloating, constipation, diarrhea or blood in the stool.    No other specific complaints in a complete review of systems (except as listed in HPI above).    Observations/Objective: There were no vitals taken for this visit. Wt Readings from Last 3 Encounters:  07/20/20 (!) 313 lb (142 kg)  07/08/19 (!) 303 lb (137.4 kg)  07/05/19 (!) 303 lb 8 oz (137.7 kg)    General: Appears her stated age, well developed, well nourished in NAD. HEENT: Head: normal  shape and size;  Nose: congestion noted; Throat/Mouth: no hoarseness noted. Pulmonary/Chest: Normal effort. No respiratory distress.   Neurological: Alert and oriented.   BMET    Component Value Date/Time   NA 139 07/20/2020 1700   K 3.5 07/20/2020 1700   CL 104 07/20/2020 1700   CO2 26 07/20/2020 1700   GLUCOSE 99 07/20/2020 1700   BUN 12 07/20/2020 1700   CREATININE 0.76 07/20/2020 1700   CALCIUM 9.1 07/20/2020 1700   GFRNONAA >60 11/08/2015 1502   GFRAA >60 11/08/2015 1502    Lipid Panel      Component Value Date/Time   CHOL 166 06/15/2019 1200   TRIG 62.0 06/15/2019 1200   HDL 55.50 06/15/2019 1200   CHOLHDL 3 06/15/2019 1200   VLDL 12.4 06/15/2019 1200   LDLCALC 98 06/15/2019 1200    CBC    Component Value Date/Time   WBC 7.2 08/30/2019 1434   RBC 3.92 08/30/2019 1434   HGB 7.6 (L) 08/30/2019 1434   HCT 27.3 (L) 08/30/2019 1434   PLT 338 08/30/2019 1434   MCV 69.6 (L) 08/30/2019 1434   MCH 19.4 (L) 08/30/2019 1434   MCHC 27.8 (L) 08/30/2019 1434   RDW 25.3 (H) 08/30/2019 1434   LYMPHSABS 2.2 08/30/2019 1434   MONOABS 0.5 08/30/2019 1434   EOSABS 0.1 08/30/2019 1434   BASOSABS 0.1 08/30/2019 1434    Hgb A1C Lab Results  Component Value Date   HGBA1C 6.4 06/15/2019        Assessment and Plan:  Allergic Rhinitis:  Advised her to start Flonase or Afrin OTC- use Afrin for no more than 3 days Ok to continue Nyquil at bedtime No indication for Covid testing at this time Work note provided Return precautions discussed  Follow Up Instructions:    I discussed the assessment and treatment plan with the patient. The patient was provided an opportunity to ask questions and all were answered. The patient agreed with the plan and demonstrated an understanding of the instructions.   The patient was advised to call back or seek an in-person evaluation if the symptoms worsen or if the condition fails to improve as anticipated.   [name], NP

## 2020-10-18 NOTE — Patient Instructions (Signed)
Allergic Rhinitis, Adult Allergic rhinitis is a reaction to allergens in the air. Allergens are tiny specks (particles) in the air that cause your body to have an allergic reaction. This condition cannot be passed from person to person (is not contagious). Allergic rhinitis cannot be cured, but it can be controlled. There are two types of allergic rhinitis:  Seasonal. This type is also called hay fever. It happens only during certain times of the year.  Perennial. This type can happen at any time of the year. What are the causes? This condition may be caused by:  Pollen from grasses, trees, and weeds.  House dust mites.  Pet dander.  Mold. What are the signs or symptoms? Symptoms of this condition include:  Sneezing.  Runny or stuffy nose (nasal congestion).  A lot of mucus in the back of the throat (postnasal drip).  Itchy nose.  Tearing of the eyes.  Trouble sleeping.  Being sleepy during day. How is this treated? There is no cure for this condition. You should avoid things that trigger your symptoms (allergens). Treatment can help to relieve symptoms. This may include:  Medicines that block allergy symptoms, such as antihistamines. These may be given as a shot, nasal spray, or pill.  Shots that are given until your body becomes less sensitive to the allergen (desensitization).  Stronger medicines, if all other treatments have not worked. Follow these instructions at home: Avoiding allergens   Find out what you are allergic to. Common allergens include smoke, dust, and pollen.  Avoid them if you can. These are some of the things that you can do to avoid allergens: ? Replace carpet with wood, tile, or vinyl flooring. Carpet can trap dander and dust. ? Clean any mold found in the home. ? Do not smoke. Do not allow smoking in your home. ? Change your heating and air conditioning filter at least once a month. ? During allergy season:  Keep windows closed as much as  you can. If possible, use air conditioning when there is a lot of pollen in the air.  Use a special filter for allergies with your furnace and air conditioner.  Plan outdoor activities when pollen counts are lowest. This is usually during the early morning or evening hours.  If you do go outdoors when pollen count is high, wear a special mask for people with allergies.  When you come indoors, take a shower and change your clothes before sitting on furniture or bedding. General instructions  Do not use fans in your home.  Do not hang clothes outside to dry.  Wear sunglasses to keep pollen out of your eyes.  Wash your hands right away after you touch household pets.  Take over-the-counter and prescription medicines only as told by your doctor.  Keep all follow-up visits as told by your doctor. This is important. Contact a doctor if:  You have a fever.  You have a cough that does not go away (is persistent).  You start to make whistling sounds when you breathe (wheeze).  Your symptoms do not get better with treatment.  You have thick fluid coming from your nose.  You start to have nosebleeds. Get help right away if:  Your tongue or your lips are swollen.  You have trouble breathing.  You feel dizzy or you feel like you are going to pass out (faint).  You have cold sweats. Summary  Allergic rhinitis is a reaction to allergens in the air.  This condition may be   caused by allergens. These include pollen, dust mites, pet dander, and mold.  Symptoms include a runny, itchy nose, sneezing, or tearing eyes. You may also have trouble sleeping or feel sleepy during the day.  Treatment includes taking medicines and avoiding allergens. You may also get shots or take stronger medicines.  Get help if you have a fever or a cough that does not stop. Get help right away if you are short of breath. This information is not intended to replace advice given to you by your health care  provider. Make sure you discuss any questions you have with your health care provider. Document Revised: 02/09/2019 Document Reviewed: 05/12/2018 Elsevier Patient Education  2020 Elsevier Inc.  

## 2021-01-02 ENCOUNTER — Telehealth: Payer: Self-pay | Admitting: Internal Medicine

## 2021-01-02 NOTE — Telephone Encounter (Signed)
[facility] TO CALL BACK TO CHANGE APT TIME TO 245 IN OFFICE TO LISTEN TO HER HEART FOR BP ISSUE

## 2021-01-03 ENCOUNTER — Other Ambulatory Visit: Payer: Self-pay

## 2021-01-03 ENCOUNTER — Ambulatory Visit (INDEPENDENT_AMBULATORY_CARE_PROVIDER_SITE_OTHER): Payer: Self-pay | Admitting: Internal Medicine

## 2021-01-03 ENCOUNTER — Encounter: Payer: Self-pay | Admitting: Internal Medicine

## 2021-01-03 VITALS — BP 148/100 | HR 81 | Temp 97.5°F | Wt 295.0 lb

## 2021-01-03 DIAGNOSIS — R41840 Attention and concentration deficit: Secondary | ICD-10-CM

## 2021-01-03 DIAGNOSIS — Z9189 Other specified personal risk factors, not elsewhere classified: Secondary | ICD-10-CM

## 2021-01-03 DIAGNOSIS — E119 Type 2 diabetes mellitus without complications: Secondary | ICD-10-CM

## 2021-01-03 DIAGNOSIS — R6889 Other general symptoms and signs: Secondary | ICD-10-CM

## 2021-01-03 DIAGNOSIS — I10 Essential (primary) hypertension: Secondary | ICD-10-CM

## 2021-01-03 MED ORDER — LOSARTAN POTASSIUM-HCTZ 50-12.5 MG PO TABS: 1.0000 | ORAL_TABLET | Freq: Every day | ORAL | 0 refills | Status: AC

## 2021-01-03 NOTE — Assessment & Plan Note (Signed)
A1C today No urine microalbumin secondary to ARB therapy Encouraged her to consume a low carb diet and exercise for weight loss Encouraged routine eye exams Encouraged routine foot exams

## 2021-01-03 NOTE — Progress Notes (Signed)
Subjective:    Patient ID: Miranda [name], female    DOB: 06-Nov-1980, 40 y.o.   MRN: [id number]  HPI  Back in school for a doctorate in family studies.  HTN: Her BP today is 148/100. She is prescribed Losartan HCT but has not been taking this consistently. She denies headaches, dizziness or blurred vision. ECG from 07/2020 reviewed.  DM 2: Her last A1C was 6.4%, 06/2019. She is not taking any oral diabetic medications at this time. She checks her feet routinely.   She is concerned about adult ADD symptoms. She reports she is currently back in school to get her doctorate in family studies. She has noticed inattention, difficulty with organization and motivation. She reports there were similar issues in childhood but her mother refused formal evaluation at that time. Review of Systems  Past Medical History:  Diagnosis Date  . Family history of breast cancer   . Obesity     Current Outpatient Medications  Medication Sig Dispense Refill  . losartan-hydrochlorothiazide (HYZAAR) 50-12.5 MG tablet Take 1 tablet by mouth daily. 30 tablet 2   No current facility-administered medications for this visit.    No Known Allergies  Family History  Adopted: Yes  Problem Relation Age of Onset  . Breast cancer Mother 53  . Healthy Sister   . Healthy Daughter     Social History   Socioeconomic History  . Marital status: Single    Spouse name: Not on file  . Number of children: Not on file  . Years of education: Not on file  . Highest education level: Not on file  Occupational History  . Not on file  Tobacco Use  . Smoking status: Never Smoker  . Smokeless tobacco: Never Used  Substance and Sexual Activity  . Alcohol use: No    Alcohol/week: 0.0 standard drinks  . Drug use: No  . Sexual activity: Never    Birth control/protection: None  Other Topics Concern  . Not on file  Social History Narrative  . Not on file   Social Determinants of Health   Financial Resource  Strain: Not on file  Food Insecurity: Not on file  Transportation Needs: Not on file  Physical Activity: Not on file  Stress: Not on file  Social Connections: Not on file  Intimate Partner Violence: Not on file     Constitutional: Denies fever, malaise, fatigue, headache or abrupt weight changes.  HEENT: Denies eye pain, eye redness, ear pain, ringing in the ears, wax buildup, runny nose, nasal congestion, bloody nose, or sore throat. Respiratory: Denies difficulty breathing, shortness of breath, cough or sputum production.   Cardiovascular: Pt reports mild swelling in legs. Denies chest pain, chest tightness, palpitations or swelling in the hands.  Gastrointestinal: Denies abdominal pain, bloating, constipation, diarrhea or blood in the stool.  GU: Denies urgency, frequency, pain with urination, burning sensation, blood in urine, odor or discharge. Skin: Denies redness, rashes, lesions or ulcercations.  Neurological: Pt reports inattention, lack of motivation, organizational skills. Denies dizziness, difficulty with memory, difficulty with speech or problems with balance and coordination.  Psych: Denies anxiety, depression, SI/HI.  No other specific complaints in a complete review of systems (except as listed in HPI above).     Objective:   Physical Exam  BP (!) 148/100   Pulse 81   Temp (!) 97.5 F (36.4 C) (Temporal)   Wt 295 lb (133.8 kg)   SpO2 98%   BMI 57.61 kg/m   Wt Readings  from Last 3 Encounters:  07/20/20 (!) 313 lb (142 kg)  07/08/19 (!) 303 lb (137.4 kg)  07/05/19 (!) 303 lb 8 oz (137.7 kg)    General: Appears herstated age, obese, in NAD. Skin: Warm, dry and intact. No ulcerations noted. Cardiovascular: Normal rate and rhythm.  Trace BLE edema. Pulmonary/Chest: Normal effort and positive vesicular breath sounds. No respiratory distress. No wheezes, rales or ronchi noted.  Neurological: Alert and oriented.  Psychiatric: Mood and affect normal. Behavior is  normal. Judgment and thought content normal.    BMET    Component Value Date/Time   NA 139 07/20/2020 1700   K 3.5 07/20/2020 1700   CL 104 07/20/2020 1700   CO2 26 07/20/2020 1700   GLUCOSE 99 07/20/2020 1700   BUN 12 07/20/2020 1700   CREATININE 0.76 07/20/2020 1700   CALCIUM 9.1 07/20/2020 1700   GFRNONAA >60 11/08/2015 1502   GFRAA >60 11/08/2015 1502    Lipid Panel     Component Value Date/Time   CHOL 166 06/15/2019 1200   TRIG 62.0 06/15/2019 1200   HDL 55.50 06/15/2019 1200   CHOLHDL 3 06/15/2019 1200   VLDL 12.4 06/15/2019 1200   LDLCALC 98 06/15/2019 1200    CBC    Component Value Date/Time   WBC 7.2 08/30/2019 1434   RBC 3.92 08/30/2019 1434   HGB 7.6 (L) 08/30/2019 1434   HCT 27.3 (L) 08/30/2019 1434   PLT 338 08/30/2019 1434   MCV 69.6 (L) 08/30/2019 1434   MCH 19.4 (L) 08/30/2019 1434   MCHC 27.8 (L) 08/30/2019 1434   RDW 25.3 (H) 08/30/2019 1434   LYMPHSABS 2.2 08/30/2019 1434   MONOABS 0.5 08/30/2019 1434   EOSABS 0.1 08/30/2019 1434   BASOSABS 0.1 08/30/2019 1434    Hgb A1C Lab Results  Component Value Date   HGBA1C 6.4 06/15/2019            Assessment & Plan:   Inattention, Difficulty with Organization, Lack of Motivation:  Will refer to psych for formal ADD testing  Will follow up after labs are back, return precautions discussed  [name], NP This visit occurred during the SARS-CoV-2 public health emergency.  Safety protocols were in place, including screening questions prior to the visit, additional usage of staff PPE, and extensive cleaning of exam room while observing appropriate contact time as indicated for disinfecting solutions.

## 2021-01-03 NOTE — Assessment & Plan Note (Signed)
Uncontrolled off meds Refilled Losartan HCT today Reinforced DASH diet and exercise for weight loss CMET today

## 2021-01-03 NOTE — Assessment & Plan Note (Signed)
Encouraged exercise for weight loss 

## 2021-01-03 NOTE — Patient Instructions (Signed)

## 2021-01-04 ENCOUNTER — Telehealth: Payer: Self-pay | Admitting: Radiology

## 2021-01-04 LAB — LIPID PANEL
Cholesterol: 171 mg/dL (ref 0–200)
HDL: 58.2 mg/dL (ref >39.00)
LDL Cholesterol: 104 mg/dL — ABNORMAL HIGH (ref 0–99)
NonHDL: 113.2
Total CHOL/HDL Ratio: 3
Triglycerides: 48 mg/dL (ref 0.0–149.0)
VLDL: 9.6 mg/dL (ref 0.0–40.0)

## 2021-01-04 LAB — COMPREHENSIVE METABOLIC PANEL
ALT: 13 U/L (ref 0–35)
AST: 14 U/L (ref 0–37)
Albumin: 4 g/dL (ref 3.5–5.2)
Alkaline Phosphatase: 72 U/L (ref 39–117)
BUN: 12 mg/dL (ref 6–23)
CO2: 28 mEq/L (ref 19–32)
Calcium: 9 mg/dL (ref 8.4–10.5)
Chloride: 105 mEq/L (ref 96–112)
Creatinine, Ser: 0.78 mg/dL (ref 0.40–1.20)
GFR: 96.13 mL/min (ref >60.00)
Glucose, Bld: 82 mg/dL (ref 70–99)
Potassium: 4.1 mEq/L (ref 3.5–5.1)
Sodium: 137 mEq/L (ref 135–145)
Total Bilirubin: 0.3 mg/dL (ref 0.2–1.2)
Total Protein: 7.2 g/dL (ref 6.0–8.3)

## 2021-01-04 LAB — CBC
HCT: 23.9 % — CL (ref 36.0–46.0)
Hemoglobin: 7.1 g/dL — CL (ref 12.0–15.0)
MCHC: 29.6 g/dL — ABNORMAL LOW (ref 30.0–36.0)
MCV: 59.2 fl — ABNORMAL LOW (ref 78.0–100.0)
Platelets: 462 10*3/uL — ABNORMAL HIGH (ref 150.0–400.0)
RBC: 4.03 Mil/uL (ref 3.87–5.11)
RDW: 20.3 % — ABNORMAL HIGH (ref 11.5–15.5)
WBC: 6.9 10*3/uL (ref 4.0–10.5)

## 2021-01-04 LAB — HEMOGLOBIN A1C: Hgb A1c MFr Bld: 6.4 % (ref 4.6–6.5)

## 2021-01-04 NOTE — Telephone Encounter (Signed)
[facility] called critical results, HGB - 7.1, HCT - 23.9. Results given to [facility]

## 2021-01-04 NOTE — Telephone Encounter (Signed)
Noted. Stable blood counts- will have her follow up with hematology

## 2021-01-09 ENCOUNTER — Encounter: Payer: Self-pay | Admitting: Internal Medicine

## 2021-02-20 ENCOUNTER — Ambulatory Visit (INDEPENDENT_AMBULATORY_CARE_PROVIDER_SITE_OTHER): Payer: BC Managed Care – PPO | Admitting: Psychology

## 2021-02-20 DIAGNOSIS — F89 Unspecified disorder of psychological development: Secondary | ICD-10-CM | POA: Diagnosis not present

## 2021-03-16 ENCOUNTER — Ambulatory Visit: Payer: BC Managed Care – PPO | Admitting: Psychology

## 2021-04-03 ENCOUNTER — Encounter: Payer: Self-pay | Admitting: Family Medicine

## 2021-04-03 ENCOUNTER — Telehealth (INDEPENDENT_AMBULATORY_CARE_PROVIDER_SITE_OTHER): Payer: Self-pay | Admitting: Family Medicine

## 2021-04-03 ENCOUNTER — Encounter: Payer: Self-pay | Admitting: Oncology

## 2021-04-03 DIAGNOSIS — U071 COVID-19: Secondary | ICD-10-CM

## 2021-04-03 MED ORDER — BENZONATATE 100 MG PO CAPS: 100.0000 mg | ORAL_CAPSULE | Freq: Three times a day (TID) | ORAL | 0 refills | Status: DC | PRN

## 2021-04-03 NOTE — Patient Instructions (Addendum)
   ---------------------------------------------------------------------------------------------------------------------------      WORK SLIP:  Patient Miranda [name],  1981/07/21, was seen for a medical visit today, 04/03/21 . Please excuse from work for a COVID like illness. We advise 10 days minimum from the onset of symptoms (03/28/21) PLUS 1 day of no fever and improved symptoms. Will defer to employer for a sooner return to work if symptoms have resolved, it is greater than 5 days since the positive test and the patient can wear a high-quality, tight fitting mask such as N95 or KN95 at all times for an additional 5 days. Would also suggest COVID19 antigen testing is negative prior to return if returning within 10 days of symptom onset.  Sincerely: E-signature: Dr. Kriste Basque, DO [facility] [place] Ph: [phone]   ------------------------------------------------------------------------------------------------------------------------------    HOME CARE TIPS:  -I sent the medication(s) we discussed to your pharmacy: Meds ordered this encounter  Medications  . benzonatate (TESSALON PERLES) 100 MG capsule    Sig: Take 1 capsule (100 mg total) by mouth 3 (three) times daily as needed.    Dispense:  20 capsule    Refill:  0    -can use tylenol or aleve if needed for fevers, aches and pains per instructions  -can use nasal saline a few times per day if you have nasal congestion; sometimes  a short course of Afrin nasal spray for 3 days can help with symptoms as well  -stay hydrated, drink plenty of fluids and eat small healthy meals - avoid dairy  -can take 1000 IU ( ) Vit D3 and 100-500 mg of Vit C daily per instructions  -If the Covid test is positive, check out the [facility] website for more information on home care, transmission and treatment for COVID19  -follow up with your doctor in 2-3 days unless improving and feeling better  -stay home while  sick, except to seek medical care. If you have COVID19, ideally it would be best to stay home for a full 10 days since the onset of symptoms PLUS one day of no fever and feeling better. Wear a good mask that fits snugly (such as N95 or KN95) if around others to reduce the risk of transmission.  It was nice to meet you today, and I really hope you are feeling better soon. I help [facility] out with telemedicine visits on Tuesdays and Thursdays and am available for visits on those days. If you have any concerns or questions following this visit please schedule a follow up visit with your Primary Care doctor or seek care at a local urgent care clinic to avoid delays in care.    Seek in person care or schedule a follow up video visit promptly if your symptoms worsen, new concerns arise or you are not improving with treatment. Call 911 and/or seek emergency care if your symptoms are severe or life threatening.

## 2021-04-03 NOTE — Progress Notes (Signed)
Virtual Visit via Video Note  I connected with Miranda [name]  on 04/03/21 at  3:20 PM EDT by a video enabled telemedicine application and verified that I am speaking with the correct person using two identifiers.  Location patient: home, [place] Location provider:work or home office Persons participating in the virtual visit: patient, provider  I discussed the limitations of evaluation and management by telemedicine and the availability of in person appointments. The patient expressed understanding and agreed to proceed.   HPI:  Acute telemedicine visit for flu like symptoms: -Onset: over 1 week ago; had a positive covid test that day and a positive PCR test -Symptoms include: chills, fatigue, now has sore throat, PND, cough, sneezing -Denies:CP, SOB, NVD, inability to eat/drink/get out of bed -Has tried:motrin -Pertinent past medical history: none per patient -Pertinent medication allergies: No Known Allergies  -denies any chance of pregnancy, FDLMP 3 weeks ago -COVID-19 vaccine status: not vaccinated  ROS: See pertinent positives and negatives per HPI.  Past Medical History:  Diagnosis Date  . Family history of breast cancer   . Obesity     History reviewed. No pertinent surgical history.   Current Outpatient Medications:  .  benzonatate (TESSALON PERLES) 100 MG capsule, Take 1 capsule (100 mg total) by mouth 3 (three) times daily as needed., Disp: 20 capsule, Rfl: 0 .  losartan-hydrochlorothiazide (HYZAAR) 50-12.5 MG tablet, Take 1 tablet by mouth daily., Disp: 30 tablet, Rfl: 0  EXAM:  VITALS per patient if applicable:  GENERAL: alert, oriented, appears well and in no acute distress  HEENT: atraumatic, conjunttiva clear, no obvious abnormalities on inspection of external nose and ears  NECK: normal movements of the head and neck  LUNGS: on inspection no signs of respiratory distress, breathing rate appears normal, no obvious gross SOB, gasping or wheezing  CV: no obvious  cyanosis  MS: moves all visible extremities without noticeable abnormality  PSYCH/NEURO: pleasant and cooperative, no obvious depression or anxiety, speech and thought processing grossly intact  ASSESSMENT AND PLAN:  Discussed the following assessment and plan:  COVID-19   Discussed treatment options, ideal treatment window, potential complications, isolation and precautions for COVID-19.  The patient opted against referral for Covid outpatient treatment at this time as is out of the treatment window. The patient mainly wanted a work note and did want a prescription for cough, Tessalon Rx sent.  Other symptomatic care measures summarized in patient instructions.  Advised to seek prompt in person care if worsening, new symptoms arise, or if is not improving with treatment. Discussed options for inperson care if PCP office not available. Did let this patient know that I only do telemedicine on Tuesdays and Thursdays for [facility]. Advised to schedule follow up visit with PCP or [facility] if any further questions or concerns to avoid delays in care.   I discussed the assessment and treatment plan with the patient. The patient was provided an opportunity to ask questions and all were answered. The patient agreed with the plan and demonstrated an understanding of the instructions.     Terressa Koyanagi, DO

## 2021-04-05 ENCOUNTER — Ambulatory Visit: Payer: BC Managed Care – PPO | Admitting: Psychology

## 2021-04-05 ENCOUNTER — Other Ambulatory Visit: Payer: Self-pay

## 2021-04-05 ENCOUNTER — Telehealth (INDEPENDENT_AMBULATORY_CARE_PROVIDER_SITE_OTHER): Payer: BC Managed Care – PPO | Admitting: Internal Medicine

## 2021-04-05 ENCOUNTER — Encounter: Payer: Self-pay | Admitting: Internal Medicine

## 2021-04-05 ENCOUNTER — Telehealth: Payer: Self-pay

## 2021-04-05 DIAGNOSIS — U071 COVID-19: Secondary | ICD-10-CM | POA: Diagnosis not present

## 2021-04-05 MED ORDER — PREDNISONE 10 MG PO TABS: ORAL_TABLET | ORAL | 0 refills | Status: DC

## 2021-04-05 MED ORDER — AZITHROMYCIN 250 MG PO TABS: ORAL_TABLET | ORAL | 0 refills | Status: DC

## 2021-04-05 NOTE — Progress Notes (Signed)
Virtual Visit via Video Note  I connected with Miranda [name] on 04/05/21 at  3:00 PM EDT by a video enabled telemedicine application and verified that I am speaking with the correct person using two identifiers.  Location: Patient: Home Provider: Office   Persons participating in this video call: Miranda Reaper, NP and Buyer, retail. I discussed the limitations of evaluation and management by telemedicine and the availability of in person appointments. The patient expressed understanding and agreed to proceed.  History of Present Illness:  Pt reports headache, facial pressure, nasal congestion, ear pain and cough.  She reports this started 1 week ago.  The headache is located behind her eyes.  She denies dizziness or visual changes.  She is not blowing any mucus out of her nose.  She describes the ear pain is fullness without drainage or loss of hearing.  The cough is productive of yellow/brown mucus.  She denies runny nose, sore throat or shortness of breath.  She has been nauseated but denies vomiting or diarrhea.  She has had some chills but denies fever or body aches.  She is Covid positive. She did a video visit 5/31 with Dr. Selena Batten, was prescribed Benzonate.   Past Medical History:  Diagnosis Date  . Family history of breast cancer   . Obesity     Current Outpatient Medications  Medication Sig Dispense Refill  . benzonatate (TESSALON PERLES) 100 MG capsule Take 1 capsule (100 mg total) by mouth 3 (three) times daily as needed. 20 capsule 0  . losartan-hydrochlorothiazide (HYZAAR) 50-12.5 MG tablet Take 1 tablet by mouth daily. 30 tablet 0   No current facility-administered medications for this visit.    No Known Allergies  Family History  Adopted: Yes  Problem Relation Age of Onset  . Breast cancer Mother 71  . Healthy Sister   . Healthy Daughter     Social History   Socioeconomic History  . Marital status: Single    Spouse name: Not on file  . Number of children:  Not on file  . Years of education: Not on file  . Highest education level: Not on file  Occupational History  . Not on file  Tobacco Use  . Smoking status: Never Smoker  . Smokeless tobacco: Never Used  Substance and Sexual Activity  . Alcohol use: No    Alcohol/week: 0.0 standard drinks  . Drug use: No  . Sexual activity: Never    Birth control/protection: None  Other Topics Concern  . Not on file  Social History Narrative  . Not on file   Social Determinants of Health   Financial Resource Strain: Not on file  Food Insecurity: Not on file  Transportation Needs: Not on file  Physical Activity: Not on file  Stress: Not on file  Social Connections: Not on file  Intimate Partner Violence: Not on file     Constitutional: Patient reports headache.  Denies fever, malaise, fatigue, or abrupt weight changes.  HEENT: Patient reports facial pressure, ear fullness and nasal congestion.  Denies eye pain, eye redness, ear pain, ringing in the ears, wax buildup, runny nose,  bloody nose, or sore throat. Respiratory: Patient reports cough.  Denies difficulty breathing, shortness of breath.   Cardiovascular: Denies chest pain, chest tightness, palpitations or swelling in the hands or feet.  Gastrointestinal: Patient reports nausea.  Denies abdominal pain, bloating, constipation, diarrhea or blood in the stool.   No other specific complaints in a complete review of systems (except as  listed in HPI above).   Observations/Objective:   Wt Readings from Last 3 Encounters:  01/03/21 295 lb (133.8 kg)  07/20/20 (!) 313 lb (142 kg)  07/08/19 (!) 303 lb (137.4 kg)    General: Appears her stated age, obese, in NAD. HEENT: Head: normal shape and size; Nose: Congestion noted; Throat/Mouth: Slight hoarseness noted Pulmonary/Chest: Normal effort.  Cough noted. no respiratory distress.  Neurological: Alert and oriented.   BMET    Component Value Date/Time   NA 137 01/03/2021 1516   K 4.1  01/03/2021 1516   CL 105 01/03/2021 1516   CO2 28 01/03/2021 1516   GLUCOSE 82 01/03/2021 1516   BUN 12 01/03/2021 1516   CREATININE 0.78 01/03/2021 1516   CALCIUM 9.0 01/03/2021 1516   GFRNONAA >60 11/08/2015 1502   GFRAA >60 11/08/2015 1502    Lipid Panel     Component Value Date/Time   CHOL 171 01/03/2021 1516   TRIG 48.0 01/03/2021 1516   HDL 58.20 01/03/2021 1516   CHOLHDL 3 01/03/2021 1516   VLDL 9.6 01/03/2021 1516   LDLCALC 104 (H) 01/03/2021 1516    CBC    Component Value Date/Time   WBC 6.9 01/03/2021 1516   RBC 4.03 01/03/2021 1516   HGB 7.1 Repeated and verified X2. (LL) 01/03/2021 1516   HCT 23.9 Repeated and verified X2. (LL) 01/03/2021 1516   PLT 462.0 (H) 01/03/2021 1516   MCV 59.2 Repeated and verified X2. (L) 01/03/2021 1516   MCH 19.4 (L) 08/30/2019 1434   MCHC 29.6 (L) 01/03/2021 1516   RDW 20.3 (H) 01/03/2021 1516   LYMPHSABS 2.2 08/30/2019 1434   MONOABS 0.5 08/30/2019 1434   EOSABS 0.1 08/30/2019 1434   BASOSABS 0.1 08/30/2019 1434    Hgb A1C Lab Results  Component Value Date   HGBA1C 6.4 01/03/2021        Assessment and Plan:  Covid 19:  Encourage rest and fluids Rx for Pred taper x6 days Rx for Azithromycin x5 days Work note provided She is past her 5-day quarantine at this point  Return precautions discussed   Follow Up Instructions:    I discussed the assessment and treatment plan with the patient. The patient was provided an opportunity to ask questions and all were answered. The patient agreed with the plan and demonstrated an understanding of the instructions.   The patient was advised to call back or seek an in-person evaluation if the symptoms worsen or if the condition fails to improve as anticipated.     Miranda Reaper, NP

## 2021-04-05 NOTE — Patient Instructions (Signed)

## 2021-04-05 NOTE — Telephone Encounter (Signed)
Copied from CRM [id number]. Topic: General - Inquiry >> Apr 05, 2021 11:10 AM Miranda [name] wrote: Reason for CRM: Patient called in stated she tested positive for Covid Wednesday 05/25, patient states she is not getting better, and would like for a nurse to reach out to see about what she can do to help. Pt also states she will be needing a note for her job, and wanted to see about getting that. Please advise.

## 2021-04-09 ENCOUNTER — Telehealth: Payer: Self-pay | Admitting: Internal Medicine

## 2021-04-11 ENCOUNTER — Encounter: Payer: Self-pay | Admitting: Internal Medicine

## 2021-04-12 ENCOUNTER — Ambulatory Visit (INDEPENDENT_AMBULATORY_CARE_PROVIDER_SITE_OTHER): Payer: BC Managed Care – PPO | Admitting: Psychology

## 2021-04-12 ENCOUNTER — Telehealth: Payer: Self-pay

## 2021-04-12 DIAGNOSIS — F900 Attention-deficit hyperactivity disorder, predominantly inattentive type: Secondary | ICD-10-CM

## 2021-04-12 DIAGNOSIS — N943 Premenstrual tension syndrome: Secondary | ICD-10-CM

## 2021-04-12 DIAGNOSIS — F411 Generalized anxiety disorder: Secondary | ICD-10-CM

## 2021-04-12 NOTE — Telephone Encounter (Signed)
Pt need a work note post her recent visit.

## 2021-04-12 NOTE — Telephone Encounter (Signed)
Can you release a work note to her Miranda [name] excusing her from the day we saw her until the following Monday

## 2021-04-19 ENCOUNTER — Encounter: Payer: Self-pay | Admitting: Internal Medicine

## 2021-04-26 ENCOUNTER — Other Ambulatory Visit: Payer: Self-pay

## 2021-04-26 ENCOUNTER — Telehealth (INDEPENDENT_AMBULATORY_CARE_PROVIDER_SITE_OTHER): Payer: BC Managed Care – PPO | Admitting: Internal Medicine

## 2021-04-26 ENCOUNTER — Encounter: Payer: Self-pay | Admitting: Internal Medicine

## 2021-04-26 VITALS — Ht 60.0 in | Wt 298.0 lb

## 2021-04-26 DIAGNOSIS — I10 Essential (primary) hypertension: Secondary | ICD-10-CM

## 2021-04-26 DIAGNOSIS — E119 Type 2 diabetes mellitus without complications: Secondary | ICD-10-CM

## 2021-04-26 NOTE — Assessment & Plan Note (Signed)
Continue Losartan HCT Reinforced DASH diet and exercise weight loss

## 2021-04-26 NOTE — Assessment & Plan Note (Signed)
Diet controlled Encourage low-carb diet and exercise for weight loss

## 2021-04-26 NOTE — Patient Instructions (Signed)

## 2021-04-26 NOTE — Progress Notes (Signed)
Virtual Visit via Video Note  I connected with [employer] planner on 04/26/21 at  8:20 AM EDT by a video enabled telemedicine application and verified that I am speaking with the correct person using two identifiers.  Location: Patient: Home Provider: Office  Persons participating in this video call: [name], NP and Pernella Jauregui   I discussed the limitations of evaluation and management by telemedicine and the availability of in person appointments. The patient expressed understanding and agreed to proceed.  History of Present Illness:  Patient wanting to discuss options for weight loss.  She has a history of hypertension and DM2.  Her last A1c was 6.4%, 01/2021.  She is morbidly obese with a weight of 298 lbs with a BMI 58.20.  She reports she has discussed with her insurance company and has a $2500 stipend to use on workout equipment for her home that she would need a DME prescription for.  Breakfast: Smoothies, or skips breakfast Lunch: Salad or sandwich Dinner: Meat and veggies Snacks: None  Exercise: None   Past Medical History:  Diagnosis Date   Family history of breast cancer    Obesity     Current Outpatient Medications  Medication Sig Dispense Refill   aspirin 500 MG tablet Take by mouth every 6 (six) hours as needed for pain.     azithromycin (ZITHROMAX) 250 MG tablet Take 2 tabs today, then 1 tab daily x 4 days 6 tablet 0   benzonatate (TESSALON PERLES) 100 MG capsule Take 1 capsule (100 mg total) by mouth 3 (three) times daily as needed. 20 capsule 0   ibuprofen (ADVIL) 200 MG tablet Take 200 mg by mouth every 6 (six) hours as needed.     losartan-hydrochlorothiazide (HYZAAR) 50-12.5 MG tablet Take 1 tablet by mouth daily. 30 tablet 0   predniSONE (DELTASONE) 10 MG tablet Take 6 tabs on day 1, 5 tabs on day 2, 4 tabs on day 3, 3 tabs on day 4, 2 tabs on day 5, 1 tab on day 6 21 tablet 0   No current facility-administered medications for this visit.    No  Known Allergies  Family History  Adopted: Yes  Problem Relation Age of Onset   Breast cancer Mother 76   Healthy Sister    Healthy Daughter     Social History   Socioeconomic History   Marital status: Single    Spouse name: Not on file   Number of children: Not on file   Years of education: Not on file   Highest education level: Not on file  Occupational History   Not on file  Tobacco Use   Smoking status: Never   Smokeless tobacco: Never  Substance and Sexual Activity   Alcohol use: No    Alcohol/week: 0.0 standard drinks   Drug use: No   Sexual activity: Never    Birth control/protection: None  Other Topics Concern   Not on file  Social History Narrative   Not on file   Social Determinants of Health   Financial Resource Strain: Not on file  Food Insecurity: Not on file  Transportation Needs: Not on file  Physical Activity: Not on file  Stress: Not on file  Social Connections: Not on file  Intimate Partner Violence: Not on file     Constitutional: Denies fever, malaise, fatigue, headache or abrupt weight changes.  Respiratory: Denies difficulty breathing, shortness of breath, cough or sputum production.   Cardiovascular: Denies chest pain, chest tightness, palpitations or  swelling in the hands or feet.  Musculoskeletal: Denies decrease in range of motion, difficulty with gait, muscle pain or joint pain and swelling.  Skin: Denies redness, rashes, lesions or ulcercations.  Neurological: Denies dizziness, difficulty with memory, difficulty with speech or problems with balance and coordination.  Psych: Patient reports anxiety about going to the gym during the pandemic.  Denies anxiety, depression, SI/HI.  No other specific complaints in a complete review of systems (except as listed in HPI above).    Observations/Objective:  Ht 5' (1.524 m)   Wt 298 lb (135.2 kg)   BMI 58.20 kg/m   Wt Readings from Last 3 Encounters:  01/03/21 295 lb (133.8 kg)   07/20/20 (!) 313 lb (142 kg)  07/08/19 (!) 303 lb (137.4 kg)    General: Appears her stated age, obese, in NAD. HEENT: Head: normal shape and size;  Pulmonary/Chest: Normal effort. No respiratory distress.  Neurological: Alert and oriented.   BMET    Component Value Date/Time   NA 137 01/03/2021 1516   K 4.1 01/03/2021 1516   CL 105 01/03/2021 1516   CO2 28 01/03/2021 1516   GLUCOSE 82 01/03/2021 1516   BUN 12 01/03/2021 1516   CREATININE 0.78 01/03/2021 1516   CALCIUM 9.0 01/03/2021 1516   GFRNONAA >60 11/08/2015 1502   GFRAA >60 11/08/2015 1502    Lipid Panel     Component Value Date/Time   CHOL 171 01/03/2021 1516   TRIG 48.0 01/03/2021 1516   HDL 58.20 01/03/2021 1516   CHOLHDL 3 01/03/2021 1516   VLDL 9.6 01/03/2021 1516   LDLCALC 104 (H) 01/03/2021 1516    CBC    Component Value Date/Time   WBC 6.9 01/03/2021 1516   RBC 4.03 01/03/2021 1516   HGB 7.1 Repeated and verified X2. (LL) 01/03/2021 1516   HCT 23.9 Repeated and verified X2. (LL) 01/03/2021 1516   PLT 462.0 (H) 01/03/2021 1516   MCV 59.2 Repeated and verified X2. (L) 01/03/2021 1516   MCH 19.4 (L) 08/30/2019 1434   MCHC 29.6 (L) 01/03/2021 1516   RDW 20.3 (H) 01/03/2021 1516   LYMPHSABS 2.2 08/30/2019 1434   MONOABS 0.5 08/30/2019 1434   EOSABS 0.1 08/30/2019 1434   BASOSABS 0.1 08/30/2019 1434    Hgb A1C Lab Results  Component Value Date   HGBA1C 6.4 01/03/2021       Assessment and Plan:  Morbid Obesity:  We will give her a letter of medical necessity for workout equipment We will give her DME Rx for look at equipment Encouraged her to consume a low-carb, high-protein diet and exercise for weight loss  Return precautions discussed  Follow Up Instructions:    I discussed the assessment and treatment plan with the patient. The patient was provided an opportunity to ask questions and all were answered. The patient agreed with the plan and demonstrated an understanding of the  instructions.   The patient was advised to call back or seek an in-person evaluation if the symptoms worsen or if the condition fails to improve as anticipated.    [name], NP

## 2022-07-05 ENCOUNTER — Ambulatory Visit: Payer: BC Managed Care – PPO | Admitting: Obstetrics & Gynecology

## 2023-11-03 ENCOUNTER — Encounter: Payer: Self-pay | Admitting: Oncology
